# Patient Record
Sex: Male | Born: 1950 | Race: White | Hispanic: No | Marital: Married | Smoking: Former smoker
Health system: Southern US, Community
[De-identification: ages and names within clinical notes are randomized; demographics above are authoritative.]

## PROBLEM LIST (undated history)

## (undated) DIAGNOSIS — M1611 Unilateral primary osteoarthritis, right hip: Secondary | ICD-10-CM

## (undated) DIAGNOSIS — M1990 Unspecified osteoarthritis, unspecified site: Secondary | ICD-10-CM

## (undated) DIAGNOSIS — M1612 Unilateral primary osteoarthritis, left hip: Secondary | ICD-10-CM

## (undated) DIAGNOSIS — E119 Type 2 diabetes mellitus without complications: Secondary | ICD-10-CM

## (undated) DIAGNOSIS — I10 Essential (primary) hypertension: Secondary | ICD-10-CM

## (undated) HISTORY — PX: FOOT SURGERY: SHX648

## (undated) HISTORY — PX: CERVICAL FUSION: SHX112

---

## 1999-09-12 ENCOUNTER — Emergency Department (HOSPITAL_COMMUNITY): Admission: EM | Admit: 1999-09-12 | Discharge: 1999-09-12 | Payer: Self-pay | Admitting: Emergency Medicine

## 2010-06-24 ENCOUNTER — Ambulatory Visit (HOSPITAL_COMMUNITY): Admission: RE | Admit: 2010-06-24 | Discharge: 2010-06-24 | Payer: Self-pay | Admitting: Neurosurgery

## 2010-07-12 ENCOUNTER — Encounter: Admission: RE | Admit: 2010-07-12 | Discharge: 2010-07-12 | Payer: Self-pay | Admitting: Neurosurgery

## 2010-12-05 ENCOUNTER — Encounter: Payer: Self-pay | Admitting: Neurosurgery

## 2011-01-28 LAB — CBC
HCT: 45.9 % (ref 39.0–52.0)
Hemoglobin: 16.1 g/dL (ref 13.0–17.0)
MCV: 94.8 fL (ref 78.0–100.0)
Platelets: 236 10*3/uL (ref 150–400)
RBC: 4.84 MIL/uL (ref 4.22–5.81)

## 2011-01-28 LAB — BASIC METABOLIC PANEL
Calcium: 9.2 mg/dL (ref 8.4–10.5)
GFR calc Af Amer: >60 mL/min (ref >60)
Potassium: 4.4 mEq/L (ref 3.5–5.1)

## 2011-01-28 LAB — SURGICAL PCR SCREEN
MRSA, PCR: NEGATIVE
Staphylococcus aureus: NEGATIVE

## 2012-08-13 ENCOUNTER — Other Ambulatory Visit: Payer: Self-pay | Admitting: Sports Medicine

## 2012-08-13 DIAGNOSIS — M25562 Pain in left knee: Secondary | ICD-10-CM

## 2012-08-14 ENCOUNTER — Ambulatory Visit
Admission: RE | Admit: 2012-08-14 | Discharge: 2012-08-14 | Disposition: A | Discharge status: Home / Self Care | Payer: Managed Care, Other (non HMO) | Source: Ambulatory Visit | Attending: Sports Medicine | Admitting: Sports Medicine

## 2012-08-14 DIAGNOSIS — M25562 Pain in left knee: Secondary | ICD-10-CM

## 2014-06-26 ENCOUNTER — Encounter (HOSPITAL_COMMUNITY): Payer: Self-pay | Admitting: Pharmacist

## 2014-06-26 NOTE — Pre-Procedure Instructions (Signed)
James Duran  06/26/2014   Your procedure is scheduled on: Tuesday, July 08, 2014  Report to [facility] Admitting at 8:15 AM.  Call this number if you have problems the morning of surgery: [phone]   Remember:   Do not eat food or drink liquids after midnight Monday, July 07, 2014   Take these medicines the morning of surgery with A SIP OF WATER: varenicline (CHANTIX)  Stop taking Aspirin,  vitamins, and herbal medications. Do not take any NSAIDs ie: Ibuprofen, Advil, Naproxen or any medication containing Aspirin; stop 7 days prior to procedure ( Tuesday, 07/01/14).   Do not wear jewelry, make-up or nail polish.  Do not wear lotions, powders, or perfumes. You may not wear deodorant.  Do not shave 48 hours prior to surgery. Men may shave face and neck.  Do not bring valuables to the hospital.  [facility] is not responsible for any belongings or valuables.               Contacts, dentures or bridgework may not be worn into surgery.  Leave suitcase in the car. After surgery it may be brought to your room.  For patients admitted to the hospital, discharge time is determined by your treatment team.               Patients discharged the day of surgery will not be allowed to drive home.  Name and phone number of your driver:   Special Instructions:  Special Instructions:Special Instructions: [facility] - Preparing for Surgery  Before surgery, you can play an important role.  Because skin is not sterile, your skin needs to be as free of germs as possible.  You can reduce the number of germs on you skin by washing with CHG (chlorahexidine gluconate) soap before surgery.  CHG is an antiseptic cleaner which kills germs and bonds with the skin to continue killing germs even after washing.  Please DO NOT use if you have an allergy to CHG or antibacterial soaps.  If your skin becomes reddened/irritated stop using the CHG and inform your nurse when you arrive at Short  Stay.  Do not shave (including legs and underarms) for at least 48 hours prior to the first CHG shower.  You may shave your face.  Please follow these instructions carefully:   1.  Shower with CHG Soap the night before surgery and the morning of Surgery.  2.  If you choose to wash your hair, wash your hair first as usual with your normal shampoo.  3.  After you shampoo, rinse your hair and body thoroughly to remove the Shampoo.  4.  Use CHG as you would any other liquid soap.  You can apply chg directly  to the skin and wash gently with scrungie or a clean washcloth.  5.  Apply the CHG Soap to your body ONLY FROM THE NECK DOWN.  Do not use on open wounds or open sores.  Avoid contact with your eyes, ears, mouth and genitals (private parts).  Wash genitals (private parts) with your normal soap.  6.  Wash thoroughly, paying special attention to the area where your surgery will be performed.  7.  Thoroughly rinse your body with warm water from the neck down.  8.  DO NOT shower/wash with your normal soap after using and rinsing off the CHG Soap.  9.  Pat yourself dry with a clean towel.            10.  Wear clean pajamas.            11.  Place clean sheets on your bed the night of your first shower and do not sleep with pets.  Day of Surgery  Do not apply any lotions/deodorants the morning of surgery.  Please wear clean clothes to the hospital/surgery center.   Please read over the following fact sheets that you were given: Pain Booklet, Coughing and Deep Breathing, Blood Transfusion Information, Total Joint Packet, MRSA Information and Surgical Site Infection Prevention

## 2014-06-27 ENCOUNTER — Encounter (HOSPITAL_COMMUNITY): Payer: Self-pay

## 2014-06-27 ENCOUNTER — Encounter (HOSPITAL_COMMUNITY)
Admission: RE | Admit: 2014-06-27 | Discharge: 2014-06-27 | Disposition: A | Discharge status: Home / Self Care | Payer: BC Managed Care – PPO | Source: Ambulatory Visit | Attending: Orthopedic Surgery | Admitting: Orthopedic Surgery

## 2014-06-27 ENCOUNTER — Encounter (HOSPITAL_COMMUNITY)
Admission: RE | Admit: 2014-06-27 | Discharge: 2014-06-27 | Disposition: A | Discharge status: Home / Self Care | Payer: BC Managed Care – PPO | Source: Ambulatory Visit | Attending: Anesthesiology | Admitting: Anesthesiology

## 2014-06-27 DIAGNOSIS — I4510 Unspecified right bundle-branch block: Secondary | ICD-10-CM | POA: Diagnosis not present

## 2014-06-27 DIAGNOSIS — E785 Hyperlipidemia, unspecified: Secondary | ICD-10-CM | POA: Insufficient documentation

## 2014-06-27 DIAGNOSIS — Z01810 Encounter for preprocedural cardiovascular examination: Secondary | ICD-10-CM | POA: Insufficient documentation

## 2014-06-27 DIAGNOSIS — E119 Type 2 diabetes mellitus without complications: Secondary | ICD-10-CM | POA: Insufficient documentation

## 2014-06-27 DIAGNOSIS — Z01812 Encounter for preprocedural laboratory examination: Secondary | ICD-10-CM | POA: Insufficient documentation

## 2014-06-27 DIAGNOSIS — F17200 Nicotine dependence, unspecified, uncomplicated: Secondary | ICD-10-CM | POA: Insufficient documentation

## 2014-06-27 DIAGNOSIS — Z01818 Encounter for other preprocedural examination: Secondary | ICD-10-CM | POA: Diagnosis present

## 2014-06-27 DIAGNOSIS — I10 Essential (primary) hypertension: Secondary | ICD-10-CM | POA: Diagnosis not present

## 2014-06-27 HISTORY — DX: Essential (primary) hypertension: I10

## 2014-06-27 HISTORY — DX: Unspecified osteoarthritis, unspecified site: M19.90

## 2014-06-27 HISTORY — DX: Type 2 diabetes mellitus without complications: E11.9

## 2014-06-27 LAB — BASIC METABOLIC PANEL
Anion gap: 12 (ref 5–15)
BUN: 19 mg/dL (ref 6–23)
CALCIUM: 9.2 mg/dL (ref 8.4–10.5)
CO2: 24 meq/L (ref 19–32)
Chloride: 104 mEq/L (ref 96–112)
Creatinine, Ser: 0.93 mg/dL (ref 0.50–1.35)
GFR calc Af Amer: >90 mL/min (ref >90)
GFR, EST NON AFRICAN AMERICAN: 89 mL/min — AB (ref >90)
Glucose, Bld: 132 mg/dL — ABNORMAL HIGH (ref 70–99)
POTASSIUM: 4.4 meq/L (ref 3.7–5.3)
Sodium: 140 mEq/L (ref 137–147)

## 2014-06-27 LAB — CBC
HCT: 43 % (ref 39.0–52.0)
HEMOGLOBIN: 14.6 g/dL (ref 13.0–17.0)
MCH: 31.7 pg (ref 26.0–34.0)
MCHC: 34 g/dL (ref 30.0–36.0)
MCV: 93.3 fL (ref 78.0–100.0)
PLATELETS: 301 10*3/uL (ref 150–400)
RBC: 4.61 MIL/uL (ref 4.22–5.81)
RDW: 12.9 % (ref 11.5–15.5)
WBC: 8.6 10*3/uL (ref 4.0–10.5)

## 2014-06-27 LAB — SURGICAL PCR SCREEN
MRSA, PCR: NEGATIVE
STAPHYLOCOCCUS AUREUS: NEGATIVE

## 2014-06-27 LAB — PROTIME-INR
INR: 0.98 (ref 0.00–1.49)
Prothrombin Time: 13 seconds (ref 11.6–15.2)

## 2014-06-27 LAB — ABO/RH: ABO/RH(D): B POS

## 2014-06-27 LAB — APTT: aPTT: 28 seconds (ref 24–37)

## 2014-06-27 NOTE — Progress Notes (Signed)
06/27/14 0831  OBSTRUCTIVE SLEEP APNEA  Have you ever been diagnosed with sleep apnea through a sleep study? No  Do you snore loudly (loud enough to be heard through closed doors)?  1  Do you often feel tired, fatigued, or sleepy during the daytime? 0  Has anyone observed you stop breathing during your sleep? 0  Do you have, or are you being treated for high blood pressure? 0  BMI more than 35 kg/m2? 1  Age over 61 years old? 1  Neck circumference greater than 40 cm/16 inches? 0  Gender: 1  Obstructive Sleep Apnea Score 4  Score 4 or greater  Results sent to PCP

## 2014-06-27 NOTE — Progress Notes (Signed)
.   Called scheduler for Dr. Beverely PaceLandau,sherri, informed no orders in epic for preadmission visit.

## 2014-06-30 ENCOUNTER — Ambulatory Visit: Payer: Self-pay | Admitting: Orthopedic Surgery

## 2014-06-30 NOTE — Progress Notes (Signed)
Anesthesia Chart Review:  Pt is 63 year old male posted for total hip arthroplasty on 07/08/14 with Dr. Dion SaucierLandau.   PMH: diabetes, HTN, hyperlipidemia. Current smoker.   Preoperative labs reviewed.   Cxr reviewed. Per radiologist, opacity on x-ray could be nipple shadow; recommends repeat with nipple markers. [facility] and [facility]. Shelba FlakeLandau's office.   EKG shows sinus bradycardia (55bpm) and incomplete RBBB. Confirmed by Dr. Nicki Guadalajarahomas Kelly who notes no changes in EKG compared with previous in 2011.   If no changes, I anticipate pt can proceed with surgery as scheduled.   Rica Mastngela Hadasah Brugger, FNP-BC [facility]/Anesthesiology Phone: [phone] 06/30/2014 3:10 PM

## 2014-07-07 MED ORDER — CEFAZOLIN SODIUM-DEXTROSE 2-3 GM-% IV SOLR
2.0000 g | INTRAVENOUS | Status: AC
  Administered 2014-07-08: 2 g via INTRAVENOUS
  Filled 2014-07-07: qty 50

## 2014-07-08 ENCOUNTER — Encounter (HOSPITAL_COMMUNITY)
Admission: RE | Disposition: A | Discharge status: Home Health | Payer: Self-pay | Source: Ambulatory Visit | Attending: Orthopedic Surgery

## 2014-07-08 ENCOUNTER — Encounter (HOSPITAL_COMMUNITY): Payer: Self-pay | Admitting: *Deleted

## 2014-07-08 ENCOUNTER — Inpatient Hospital Stay (HOSPITAL_COMMUNITY)
Admission: RE | Admit: 2014-07-08 | Discharge: 2014-07-09 | DRG: 470 | Disposition: A | Discharge status: Home Health | Payer: BC Managed Care – PPO | Source: Ambulatory Visit | Attending: Orthopedic Surgery | Admitting: Orthopedic Surgery

## 2014-07-08 ENCOUNTER — Inpatient Hospital Stay (HOSPITAL_COMMUNITY): Payer: BC Managed Care – PPO | Admitting: Anesthesiology

## 2014-07-08 ENCOUNTER — Encounter (HOSPITAL_COMMUNITY): Payer: BC Managed Care – PPO | Admitting: Emergency Medicine

## 2014-07-08 ENCOUNTER — Inpatient Hospital Stay (HOSPITAL_COMMUNITY): Payer: BC Managed Care – PPO

## 2014-07-08 DIAGNOSIS — Z981 Arthrodesis status: Secondary | ICD-10-CM

## 2014-07-08 DIAGNOSIS — M169 Osteoarthritis of hip, unspecified: Principal | ICD-10-CM | POA: Diagnosis present

## 2014-07-08 DIAGNOSIS — M1610 Unilateral primary osteoarthritis, unspecified hip: Secondary | ICD-10-CM | POA: Diagnosis present

## 2014-07-08 DIAGNOSIS — M1611 Unilateral primary osteoarthritis, right hip: Secondary | ICD-10-CM | POA: Diagnosis present

## 2014-07-08 DIAGNOSIS — I10 Essential (primary) hypertension: Secondary | ICD-10-CM | POA: Diagnosis present

## 2014-07-08 DIAGNOSIS — Z7901 Long term (current) use of anticoagulants: Secondary | ICD-10-CM

## 2014-07-08 DIAGNOSIS — Z79899 Other long term (current) drug therapy: Secondary | ICD-10-CM

## 2014-07-08 DIAGNOSIS — E119 Type 2 diabetes mellitus without complications: Secondary | ICD-10-CM | POA: Diagnosis present

## 2014-07-08 DIAGNOSIS — F17200 Nicotine dependence, unspecified, uncomplicated: Secondary | ICD-10-CM | POA: Diagnosis present

## 2014-07-08 DIAGNOSIS — G8918 Other acute postprocedural pain: Secondary | ICD-10-CM | POA: Diagnosis not present

## 2014-07-08 HISTORY — DX: Unilateral primary osteoarthritis, right hip: M16.11

## 2014-07-08 HISTORY — PX: TOTAL HIP ARTHROPLASTY: SHX124

## 2014-07-08 LAB — GLUCOSE, CAPILLARY
GLUCOSE-CAPILLARY: 131 mg/dL — AB (ref 70–99)
GLUCOSE-CAPILLARY: 115 mg/dL — AB (ref 70–99)
Glucose-Capillary: 125 mg/dL — ABNORMAL HIGH (ref 70–99)

## 2014-07-08 LAB — TYPE AND SCREEN
ABO/RH(D): B POS
ABO/RH(D): B POS
ANTIBODY SCREEN: NEGATIVE
Antibody Screen: NEGATIVE

## 2014-07-08 SURGERY — ARTHROPLASTY, HIP, TOTAL,POSTERIOR APPROACH
Anesthesia: General | Laterality: Right

## 2014-07-08 MED ORDER — RIVAROXABAN 10 MG PO TABS: 10.0000 mg | ORAL_TABLET | Freq: Every day | ORAL | Status: DC

## 2014-07-08 MED ORDER — CEFAZOLIN SODIUM-DEXTROSE 2-3 GM-% IV SOLR
2.0000 g | Freq: Four times a day (QID) | INTRAVENOUS | Status: AC
  Administered 2014-07-08 – 2014-07-09 (×2): 2 g via INTRAVENOUS
  Filled 2014-07-08 (×2): qty 50

## 2014-07-08 MED ORDER — PROMETHAZINE HCL 25 MG/ML IJ SOLN: 6.2500 mg | INTRAMUSCULAR | Status: DC | PRN

## 2014-07-08 MED ORDER — FENTANYL CITRATE 0.05 MG/ML IJ SOLN
INTRAMUSCULAR | Status: AC
  Filled 2014-07-08: qty 5

## 2014-07-08 MED ORDER — POTASSIUM CHLORIDE IN NACL 20-0.45 MEQ/L-% IV SOLN
INTRAVENOUS | Status: DC
  Administered 2014-07-08: 22:00:00 via INTRAVENOUS
  Filled 2014-07-08 (×3): qty 1000

## 2014-07-08 MED ORDER — METHOCARBAMOL 500 MG PO TABS
500.0000 mg | ORAL_TABLET | Freq: Four times a day (QID) | ORAL | Status: DC | PRN
  Administered 2014-07-08: 500 mg via ORAL
  Filled 2014-07-08: qty 1

## 2014-07-08 MED ORDER — FENTANYL CITRATE 0.05 MG/ML IJ SOLN
INTRAMUSCULAR | Status: AC
  Filled 2014-07-08: qty 2

## 2014-07-08 MED ORDER — ROCURONIUM BROMIDE 50 MG/5ML IV SOLN
INTRAVENOUS | Status: AC
  Filled 2014-07-08: qty 1

## 2014-07-08 MED ORDER — MIDAZOLAM HCL 2 MG/2ML IJ SOLN
INTRAMUSCULAR | Status: AC
  Filled 2014-07-08: qty 2

## 2014-07-08 MED ORDER — DOCUSATE SODIUM 100 MG PO CAPS
100.0000 mg | ORAL_CAPSULE | Freq: Two times a day (BID) | ORAL | Status: DC
  Administered 2014-07-08 – 2014-07-09 (×2): 100 mg via ORAL
  Filled 2014-07-08 (×2): qty 1

## 2014-07-08 MED ORDER — STERILE WATER FOR INJECTION IJ SOLN
INTRAMUSCULAR | Status: AC
  Filled 2014-07-08: qty 10

## 2014-07-08 MED ORDER — NEOSTIGMINE METHYLSULFATE 10 MG/10ML IV SOLN
INTRAVENOUS | Status: DC | PRN
  Administered 2014-07-08: 4 mg via INTRAVENOUS

## 2014-07-08 MED ORDER — LIDOCAINE HCL (CARDIAC) 20 MG/ML IV SOLN
INTRAVENOUS | Status: AC
  Filled 2014-07-08: qty 20

## 2014-07-08 MED ORDER — KETOROLAC TROMETHAMINE 15 MG/ML IJ SOLN
INTRAMUSCULAR | Status: AC
  Filled 2014-07-08: qty 1

## 2014-07-08 MED ORDER — LOSARTAN POTASSIUM 50 MG PO TABS
50.0000 mg | ORAL_TABLET | Freq: Every day | ORAL | Status: DC
  Administered 2014-07-08 – 2014-07-09 (×2): 50 mg via ORAL
  Filled 2014-07-08 (×2): qty 1

## 2014-07-08 MED ORDER — ONDANSETRON HCL 4 MG/2ML IJ SOLN: 4.0000 mg | Freq: Four times a day (QID) | INTRAMUSCULAR | Status: DC | PRN

## 2014-07-08 MED ORDER — HYDROMORPHONE HCL PF 1 MG/ML IJ SOLN: 1.0000 mg | INTRAMUSCULAR | Status: DC | PRN

## 2014-07-08 MED ORDER — HYDRALAZINE HCL 20 MG/ML IJ SOLN
INTRAMUSCULAR | Status: DC | PRN
  Administered 2014-07-08: 5 mg via INTRAVENOUS

## 2014-07-08 MED ORDER — NEOSTIGMINE METHYLSULFATE 10 MG/10ML IV SOLN
INTRAVENOUS | Status: AC
  Filled 2014-07-08: qty 1

## 2014-07-08 MED ORDER — ACETAMINOPHEN 650 MG RE SUPP: 650.0000 mg | Freq: Four times a day (QID) | RECTAL | Status: DC | PRN

## 2014-07-08 MED ORDER — OXYCODONE-ACETAMINOPHEN 10-325 MG PO TABS: 1.0000 | ORAL_TABLET | Freq: Four times a day (QID) | ORAL | Status: DC | PRN

## 2014-07-08 MED ORDER — PROPOFOL 10 MG/ML IV BOLUS
INTRAVENOUS | Status: AC
  Filled 2014-07-08: qty 20

## 2014-07-08 MED ORDER — LACTATED RINGERS IV SOLN
INTRAVENOUS | Status: DC
  Administered 2014-07-08 (×2): via INTRAVENOUS

## 2014-07-08 MED ORDER — MAGNESIUM CITRATE PO SOLN: 1.0000 | Freq: Once | ORAL | Status: AC | PRN

## 2014-07-08 MED ORDER — RIVAROXABAN 10 MG PO TABS
10.0000 mg | ORAL_TABLET | Freq: Every day | ORAL | Status: DC
  Administered 2014-07-09: 10 mg via ORAL
  Filled 2014-07-08 (×2): qty 1

## 2014-07-08 MED ORDER — SENNA-DOCUSATE SODIUM 8.6-50 MG PO TABS: 2.0000 | ORAL_TABLET | Freq: Every day | ORAL | Status: DC

## 2014-07-08 MED ORDER — EPHEDRINE SULFATE 50 MG/ML IJ SOLN
INTRAMUSCULAR | Status: AC
  Filled 2014-07-08: qty 1

## 2014-07-08 MED ORDER — PROPOFOL 10 MG/ML IV BOLUS
INTRAVENOUS | Status: DC | PRN
  Administered 2014-07-08: 160 ug via INTRAVENOUS

## 2014-07-08 MED ORDER — DEXAMETHASONE SODIUM PHOSPHATE 4 MG/ML IJ SOLN
INTRAMUSCULAR | Status: AC
  Filled 2014-07-08: qty 1

## 2014-07-08 MED ORDER — FENTANYL CITRATE 0.05 MG/ML IJ SOLN
INTRAMUSCULAR | Status: DC | PRN
  Administered 2014-07-08 (×2): 100 ug via INTRAVENOUS
  Administered 2014-07-08 (×4): 50 ug via INTRAVENOUS
  Administered 2014-07-08: 100 ug via INTRAVENOUS

## 2014-07-08 MED ORDER — ONDANSETRON HCL 4 MG/2ML IJ SOLN
INTRAMUSCULAR | Status: AC
  Filled 2014-07-08: qty 4

## 2014-07-08 MED ORDER — METOCLOPRAMIDE HCL 5 MG/ML IJ SOLN: 5.0000 mg | Freq: Three times a day (TID) | INTRAMUSCULAR | Status: DC | PRN

## 2014-07-08 MED ORDER — ONDANSETRON HCL 4 MG/2ML IJ SOLN
INTRAMUSCULAR | Status: DC | PRN
  Administered 2014-07-08: 4 mg via INTRAVENOUS

## 2014-07-08 MED ORDER — BISACODYL 10 MG RE SUPP: 10.0000 mg | Freq: Every day | RECTAL | Status: DC | PRN

## 2014-07-08 MED ORDER — ALUM & MAG HYDROXIDE-SIMETH 200-200-20 MG/5ML PO SUSP: 30.0000 mL | ORAL | Status: DC | PRN

## 2014-07-08 MED ORDER — FENTANYL CITRATE 0.05 MG/ML IJ SOLN
INTRAMUSCULAR | Status: AC
Start: 2014-07-08 — End: 2014-07-09
  Filled 2014-07-08: qty 2

## 2014-07-08 MED ORDER — VARENICLINE TARTRATE 1 MG PO TABS
1.0000 mg | ORAL_TABLET | Freq: Every day | ORAL | Status: DC
  Administered 2014-07-09: 1 mg via ORAL
  Filled 2014-07-08: qty 1

## 2014-07-08 MED ORDER — ONDANSETRON HCL 4 MG PO TABS: 4.0000 mg | ORAL_TABLET | Freq: Four times a day (QID) | ORAL | Status: DC | PRN

## 2014-07-08 MED ORDER — ROCURONIUM BROMIDE 50 MG/5ML IV SOLN
INTRAVENOUS | Status: AC
  Filled 2014-07-08: qty 4

## 2014-07-08 MED ORDER — METHOCARBAMOL 1000 MG/10ML IJ SOLN
500.0000 mg | Freq: Four times a day (QID) | INTRAMUSCULAR | Status: DC | PRN
  Filled 2014-07-08: qty 5

## 2014-07-08 MED ORDER — METOCLOPRAMIDE HCL 10 MG PO TABS: 5.0000 mg | ORAL_TABLET | Freq: Three times a day (TID) | ORAL | Status: DC | PRN

## 2014-07-08 MED ORDER — MEPERIDINE HCL 25 MG/ML IJ SOLN
6.2500 mg | INTRAMUSCULAR | Status: DC | PRN
  Administered 2014-07-08: 12.5 mg via INTRAVENOUS

## 2014-07-08 MED ORDER — BACLOFEN 10 MG PO TABS: 10.0000 mg | ORAL_TABLET | Freq: Three times a day (TID) | ORAL | Status: DC

## 2014-07-08 MED ORDER — LIDOCAINE HCL (CARDIAC) 20 MG/ML IV SOLN
INTRAVENOUS | Status: DC | PRN
  Administered 2014-07-08: 8 mg via INTRAVENOUS

## 2014-07-08 MED ORDER — GLYCOPYRROLATE 0.2 MG/ML IJ SOLN
INTRAMUSCULAR | Status: DC | PRN
  Administered 2014-07-08: 0.6 mg via INTRAVENOUS

## 2014-07-08 MED ORDER — POLYETHYLENE GLYCOL 3350 17 G PO PACK
17.0000 g | PACK | Freq: Every day | ORAL | Status: DC | PRN
Start: 2014-07-08 — End: 2014-07-09

## 2014-07-08 MED ORDER — ACETAMINOPHEN 325 MG PO TABS: 650.0000 mg | ORAL_TABLET | Freq: Four times a day (QID) | ORAL | Status: DC | PRN

## 2014-07-08 MED ORDER — KETOROLAC TROMETHAMINE 15 MG/ML IJ SOLN
15.0000 mg | Freq: Four times a day (QID) | INTRAMUSCULAR | Status: AC
  Administered 2014-07-08 – 2014-07-09 (×4): 15 mg via INTRAVENOUS
  Filled 2014-07-08 (×3): qty 1

## 2014-07-08 MED ORDER — GLYCOPYRROLATE 0.2 MG/ML IJ SOLN
INTRAMUSCULAR | Status: AC
  Filled 2014-07-08: qty 3

## 2014-07-08 MED ORDER — METHOCARBAMOL 500 MG PO TABS
ORAL_TABLET | ORAL | Status: AC
Start: 2014-07-08 — End: 2014-07-09
  Filled 2014-07-08: qty 1

## 2014-07-08 MED ORDER — ONDANSETRON HCL 4 MG/2ML IJ SOLN
INTRAMUSCULAR | Status: AC
  Filled 2014-07-08: qty 2

## 2014-07-08 MED ORDER — CITRIC ACID-SODIUM CITRATE 334-500 MG/5ML PO SOLN
30.0000 mL | Freq: Once | ORAL | Status: AC
  Administered 2014-07-08: 30 mL via ORAL
  Filled 2014-07-08: qty 15

## 2014-07-08 MED ORDER — MEPERIDINE HCL 25 MG/ML IJ SOLN
INTRAMUSCULAR | Status: AC
  Filled 2014-07-08: qty 1

## 2014-07-08 MED ORDER — SENNA 8.6 MG PO TABS
1.0000 | ORAL_TABLET | Freq: Two times a day (BID) | ORAL | Status: DC
  Administered 2014-07-08 – 2014-07-09 (×2): 8.6 mg via ORAL
  Filled 2014-07-08 (×3): qty 1

## 2014-07-08 MED ORDER — ONDANSETRON HCL 4 MG PO TABS: 4.0000 mg | ORAL_TABLET | Freq: Three times a day (TID) | ORAL | Status: DC | PRN

## 2014-07-08 MED ORDER — OXYCODONE HCL 5 MG PO TABS: 5.0000 mg | ORAL_TABLET | ORAL | Status: DC | PRN

## 2014-07-08 MED ORDER — MENTHOL 3 MG MT LOZG: 1.0000 | LOZENGE | OROMUCOSAL | Status: DC | PRN

## 2014-07-08 MED ORDER — SIMVASTATIN 5 MG PO TABS
5.0000 mg | ORAL_TABLET | Freq: Every day | ORAL | Status: DC
Start: 2014-07-09 — End: 2014-07-09
  Filled 2014-07-08: qty 1

## 2014-07-08 MED ORDER — SUCCINYLCHOLINE CHLORIDE 20 MG/ML IJ SOLN
INTRAMUSCULAR | Status: AC
  Filled 2014-07-08: qty 1

## 2014-07-08 MED ORDER — FENTANYL CITRATE 0.05 MG/ML IJ SOLN
INTRAMUSCULAR | Status: AC
Start: 2014-07-08 — End: 2014-07-08
  Filled 2014-07-08: qty 5

## 2014-07-08 MED ORDER — 0.9 % SODIUM CHLORIDE (POUR BTL) OPTIME
TOPICAL | Status: DC | PRN
  Administered 2014-07-08: 1000 mL

## 2014-07-08 MED ORDER — SUCCINYLCHOLINE CHLORIDE 20 MG/ML IJ SOLN
INTRAMUSCULAR | Status: DC | PRN
  Administered 2014-07-08: 100 mg via INTRAVENOUS

## 2014-07-08 MED ORDER — FENTANYL CITRATE 0.05 MG/ML IJ SOLN
25.0000 ug | INTRAMUSCULAR | Status: DC | PRN
  Administered 2014-07-08 (×3): 50 ug via INTRAVENOUS

## 2014-07-08 MED ORDER — PHENOL 1.4 % MT LIQD: 1.0000 | OROMUCOSAL | Status: DC | PRN

## 2014-07-08 MED ORDER — DEXAMETHASONE SODIUM PHOSPHATE 4 MG/ML IJ SOLN
INTRAMUSCULAR | Status: DC | PRN
  Administered 2014-07-08: 4 mg via INTRAVENOUS

## 2014-07-08 MED ORDER — ROCURONIUM BROMIDE 100 MG/10ML IV SOLN
INTRAVENOUS | Status: DC | PRN
  Administered 2014-07-08: 25 mg via INTRAVENOUS
  Administered 2014-07-08: 10 mg via INTRAVENOUS

## 2014-07-08 MED ORDER — INSULIN ASPART 100 UNIT/ML ~~LOC~~ SOLN
0.0000 [IU] | Freq: Three times a day (TID) | SUBCUTANEOUS | Status: DC
  Administered 2014-07-09: 3 [IU] via SUBCUTANEOUS
  Administered 2014-07-09: 2 [IU] via SUBCUTANEOUS

## 2014-07-08 MED ORDER — DIPHENHYDRAMINE HCL 12.5 MG/5ML PO ELIX: 12.5000 mg | ORAL_SOLUTION | ORAL | Status: DC | PRN

## 2014-07-08 SURGICAL SUPPLY — 61 items
BENZOIN TINCTURE PRP APPL 2/3 (GAUZE/BANDAGES/DRESSINGS) ×3 IMPLANT
BIT DRILL 5/64X5 DISP (BIT) ×3 IMPLANT
BLADE SAW SAG 73X25 THK (BLADE) ×2
BLADE SAW SGTL 73X25 THK (BLADE) ×1 IMPLANT
BRUSH FEMORAL CANAL (MISCELLANEOUS) IMPLANT
CAPT HIP PF MOP ×3 IMPLANT
CLOSURE STERI-STRIP 1/2X4 (GAUZE/BANDAGES/DRESSINGS) ×1
CLSR STERI-STRIP ANTIMIC 1/2X4 (GAUZE/BANDAGES/DRESSINGS) ×2 IMPLANT
COVER SURGICAL LIGHT HANDLE (MISCELLANEOUS) ×3 IMPLANT
DRAPE INCISE IOBAN 66X45 STRL (DRAPES) ×3 IMPLANT
DRAPE ORTHO SPLIT 77X108 STRL (DRAPES) ×4
DRAPE PROXIMA HALF (DRAPES) ×3 IMPLANT
DRAPE SURG ORHT 6 SPLT 77X108 (DRAPES) ×2 IMPLANT
DRAPE U-SHAPE 47X51 STRL (DRAPES) ×3 IMPLANT
DRILL BIT 5/64 (BIT) ×3 IMPLANT
DRSG MEPILEX BORDER 4X12 (GAUZE/BANDAGES/DRESSINGS) IMPLANT
DRSG MEPILEX BORDER 4X8 (GAUZE/BANDAGES/DRESSINGS) ×3 IMPLANT
DRSG PAD ABDOMINAL 8X10 ST (GAUZE/BANDAGES/DRESSINGS) IMPLANT
DURAPREP 26ML APPLICATOR (WOUND CARE) ×3 IMPLANT
ELECT CAUTERY BLADE 6.4 (BLADE) ×3 IMPLANT
ELECT REM PT RETURN 9FT ADLT (ELECTROSURGICAL) ×3
ELECTRODE REM PT RTRN 9FT ADLT (ELECTROSURGICAL) ×1 IMPLANT
GAUZE SPONGE 4X4 12PLY STRL (GAUZE/BANDAGES/DRESSINGS) IMPLANT
GLOVE BIOGEL PI IND STRL 6.5 (GLOVE) ×1 IMPLANT
GLOVE BIOGEL PI INDICATOR 6.5 (GLOVE) ×2
GLOVE BIOGEL PI ORTHO PRO SZ8 (GLOVE) ×2
GLOVE ECLIPSE 6.5 STRL STRAW (GLOVE) ×3 IMPLANT
GLOVE ORTHO TXT STRL SZ7.5 (GLOVE) ×3 IMPLANT
GLOVE PI ORTHO PRO STRL SZ8 (GLOVE) ×1 IMPLANT
GLOVE SURG ORTHO 8.0 STRL STRW (GLOVE) ×3 IMPLANT
GOWN STRL REUS W/ TWL LRG LVL3 (GOWN DISPOSABLE) ×2 IMPLANT
GOWN STRL REUS W/TWL LRG LVL3 (GOWN DISPOSABLE) ×4
HANDPIECE INTERPULSE COAX TIP (DISPOSABLE)
HOOD PEEL AWAY FACE SHEILD DIS (HOOD) ×9 IMPLANT
KIT BASIN OR (CUSTOM PROCEDURE TRAY) ×3 IMPLANT
KIT ROOM TURNOVER OR (KITS) ×3 IMPLANT
MANIFOLD NEPTUNE II (INSTRUMENTS) ×3 IMPLANT
NEEDLE HYPO 25GX1X1/2 BEV (NEEDLE) ×3 IMPLANT
NS IRRIG 1000ML POUR BTL (IV SOLUTION) ×3 IMPLANT
PACK TOTAL JOINT (CUSTOM PROCEDURE TRAY) ×3 IMPLANT
PAD ARMBOARD 7.5X6 YLW CONV (MISCELLANEOUS) ×6 IMPLANT
PILLOW ABDUCTION HIP (SOFTGOODS) ×3 IMPLANT
PRESSURIZER FEMORAL UNIV (MISCELLANEOUS) IMPLANT
RETRIEVER SUT HEWSON (MISCELLANEOUS) ×3 IMPLANT
SET HNDPC FAN SPRY TIP SCT (DISPOSABLE) IMPLANT
SPONGE LAP 4X18 X RAY DECT (DISPOSABLE) IMPLANT
SUCTION FRAZIER TIP 10 FR DISP (SUCTIONS) ×3 IMPLANT
SUT FIBERWIRE #2 38 REV NDL BL (SUTURE) ×9
SUT MNCRL AB 4-0 PS2 18 (SUTURE) ×3 IMPLANT
SUT VIC AB 0 CT1 27 (SUTURE) ×4
SUT VIC AB 0 CT1 27XBRD ANBCTR (SUTURE) ×2 IMPLANT
SUT VIC AB 2-0 CT1 27 (SUTURE) ×4
SUT VIC AB 2-0 CT1 TAPERPNT 27 (SUTURE) ×2 IMPLANT
SUT VIC AB 3-0 SH 8-18 (SUTURE) ×3 IMPLANT
SUTURE FIBERWR#2 38 REV NDL BL (SUTURE) ×3 IMPLANT
SYR CONTROL 10ML LL (SYRINGE) ×3 IMPLANT
TOWEL OR 17X24 6PK STRL BLUE (TOWEL DISPOSABLE) ×3 IMPLANT
TOWEL OR 17X26 10 PK STRL BLUE (TOWEL DISPOSABLE) ×3 IMPLANT
TOWER CARTRIDGE SMART MIX (DISPOSABLE) IMPLANT
TRAY FOLEY CATH 14FR (SET/KITS/TRAYS/PACK) IMPLANT
WATER STERILE IRR 1000ML POUR (IV SOLUTION) IMPLANT

## 2014-07-08 NOTE — Discharge Instructions (Signed)
Diet: As you were doing prior to hospitalization  ° °Shower:  May shower but keep the wounds dry, use an occlusive plastic wrap, NO SOAKING IN TUB.  If the bandage gets wet, change with a clean dry gauze. ° °Dressing:  You may change your dressing 3-5 days after surgery.  Then change the dressing daily with sterile gauze dressing.   ° °There are sticky tapes (steri-strips) on your wounds and all the stitches are absorbable.  Leave the steri-strips in place when changing your dressings, they will peel off with time, usually 2-3 weeks. ° °Activity:  Increase activity slowly as tolerated, but follow the weight bearing instructions below.  No lifting or driving for 6 weeks. ° °Weight Bearing:   As tolerated.   ° °To prevent constipation: you may use a stool softener such as - ° °Colace (over the counter) 100 mg by mouth twice a day  °Drink plenty of fluids (prune juice may be helpful) and high fiber foods °Miralax (over the counter) for constipation as needed.   ° °Itching:  If you experience itching with your medications, try taking only a single pain pill, or even half a pain pill at a time.  You may take up to 10 pain pills per day, and you can also use benadryl over the counter for itching or also to help with sleep.  ° °Precautions:  If you experience chest pain or shortness of breath - call 911 immediately for transfer to the hospital emergency department!! ° °If you develop a fever greater that 101 F, purulent drainage from wound, increased redness or drainage from wound, or calf pain -- Call the office at [phone]                                                °Follow- Up Appointment:  Please call for an appointment to be seen in 2 weeks [place] - [phone] ° ° ° ° ° °

## 2014-07-08 NOTE — Transfer of Care (Signed)
Immediate Anesthesia Transfer of Care Note  Patient: James Duran  Procedure(s) Performed: Procedure(s): TOTAL HIP ARTHROPLASTY (Right)  Patient Location: PACU  Anesthesia Type:General  Level of Consciousness: awake, alert  and oriented  Airway & Oxygen Therapy: Patient Spontanous Breathing and Patient connected to nasal cannula oxygen  Post-op Assessment: Report given to PACU RN and Post -op Vital signs reviewed and stable  Post vital signs: Reviewed and stable  Complications: No apparent anesthesia complications

## 2014-07-08 NOTE — Progress Notes (Signed)
Dr. Berneice Heinrich was notified of patient drinking 8oz of coffee with milk in it

## 2014-07-08 NOTE — H&P (Signed)
PREOPERATIVE H&P  Chief Complaint: djd right hip  HPI: James Duran is a 63 y.o. male who presents for preoperative history and physical with a diagnosis of djd right hip. Symptoms are rated as moderate to severe, and have been worsening.  This is significantly impairing activities of daily living.  He has elected for surgical management. He has failed injections, activity modification, anti-inflammatories, and assistive devices.   Past Medical History  Diagnosis Date  . Diabetes mellitus without complication   . Arthritis   . Hypertension    Past Surgical History  Procedure Laterality Date  . Cervical fusion    . Foot surgery Right    History   Social History  . Marital Status: Married    Spouse Name: N/A    Number of Children: N/A  . Years of Education: N/A   Social History Main Topics  . Smoking status: Current Some Day Smoker -- 0.25 packs/day for 40 years    Types: Cigarettes  . Smokeless tobacco: None  . Alcohol Use: Yes     Comment: 2 glasses daily  . Drug Use: No  . Sexual Activity: None   Other Topics Concern  . None   Social History Narrative  . None   History reviewed. No pertinent family history. No Known Allergies Prior to Admission medications   Medication Sig Start Date End Date Taking? Authorizing Provider  aspirin (ASPIRIN EC) 81 MG EC tablet Take 81 mg by mouth daily. Swallow whole.   Yes Historical Provider, MD  losartan (COZAAR) 50 MG tablet Take 50 mg by mouth daily.   Yes Historical Provider, MD  metFORMIN (GLUMETZA) 500 MG (MOD) 24 hr tablet Take 500 mg by mouth daily with breakfast.   Yes Historical Provider, MD  pravastatin (PRAVACHOL) 10 MG tablet Take 10 mg by mouth daily.   Yes Historical Provider, MD  varenicline (CHANTIX) 1 MG tablet Take 1 mg by mouth daily.   Yes Historical Provider, MD     Positive ROS: All other systems have been reviewed and were otherwise negative with the exception of those mentioned in the HPI and as  above.  Physical Exam: General: Alert, no acute distress Cardiovascular: No pedal edema Respiratory: No cyanosis, no use of accessory musculature GI: No organomegaly, abdomen is soft and non-tender Skin: No lesions in the area of chief complaint Neurologic: Sensation intact distally Psychiatric: Patient is competent for consent with normal mood and affect Lymphatic: No axillary or cervical lymphadenopathy  MUSCULOSKELETAL: right hip rom 0-95 degrees, minimal IR, ER 10 deg.  EHL FHL intact.    Assessment: djd right hip  Plan: Plan for Procedure(s): TOTAL HIP ARTHROPLASTY  The risks benefits and alternatives were discussed with the patient including but not limited to the risks of nonoperative treatment, versus surgical intervention including infection, bleeding, nerve injury,  blood clots, cardiopulmonary complications, morbidity, mortality, among others, and they were willing to proceed.   He apparently had coffee with some milk this morning, and so we will plan to make him last case of the day if okay with anesthesia.   Eulas Post, MD Cell [phone]   07/08/2014 9:20 AM

## 2014-07-08 NOTE — Anesthesia Preprocedure Evaluation (Addendum)
Anesthesia Evaluation   Patient awake    Reviewed: Allergy & Precautions, H&P , NPO status , Patient's Chart, lab work & pertinent test results  Airway Mallampati: II TM Distance: >3 FB Neck ROM: Full    Dental  (+) Teeth Intact   Pulmonary Current Smoker (10 pack years),  breath sounds clear to auscultation        Cardiovascular hypertension, Rhythm:Regular Rate:Normal     Neuro/Psych    GI/Hepatic   Endo/Other  diabetes, Well Controlled, Type 2  Renal/GU      Musculoskeletal   Abdominal (+)  Abdomen: soft.    Peds  Hematology   Anesthesia Other Findings Had black coffee with one teaspoon milk at 7:00am.  Will give Bicitra PO pre-op  Reproductive/Obstetrics                          Anesthesia Physical Anesthesia Plan  ASA: III  Anesthesia Plan: General   Post-op Pain Management:    Induction: Intravenous and Rapid sequence  Airway Management Planned: Oral ETT  Additional Equipment:   Intra-op Plan:   Post-operative Plan: Extubation in OR  Informed Consent: I have reviewed the patients History and Physical, chart, labs and discussed the procedure including the risks, benefits and alternatives for the proposed anesthesia with the patient or authorized representative who has indicated his/her understanding and acceptance.     Plan Discussed with:   Anesthesia Plan Comments:        Anesthesia Quick Evaluation

## 2014-07-08 NOTE — Op Note (Signed)
07/08/2014  3:28 PM  PATIENT:  James Duran   MRN: [id number]  PRE-OPERATIVE DIAGNOSIS:  djd right hip  POST-OPERATIVE DIAGNOSIS:  djd right hip  PROCEDURE:  Procedure(s): TOTAL HIP ARTHROPLASTY  PREOPERATIVE INDICATIONS:    James Duran is an 63 y.o. male who has a diagnosis of Osteoarthritis of right hip and elected for surgical management after failing conservative treatment.  The risks benefits and alternatives were discussed with the patient including but not limited to the risks of nonoperative treatment, versus surgical intervention including infection, bleeding, nerve injury, periprosthetic fracture, the need for revision surgery, dislocation, leg length discrepancy, blood clots, cardiopulmonary complications, morbidity, mortality, among others, and they were willing to proceed.     OPERATIVE REPORT     SURGEON:  Marchia Bond, MD    ASSISTANT:  Joya Gaskins, OPA-C  (Present throughout the entire procedure,  necessary for completion of procedure in a timely manner, assisting with retraction, instrumentation, and closure)     ANESTHESIA:  General    COMPLICATIONS:  None.     COMPONENTS:  [employer] fit femur size 6 with a 36 mm +5 head ball and a gription acetabular shell size 54 with a 10 degree lipped polyethylene liner    PROCEDURE IN DETAIL:   The patient was met in the holding area and  identified.  The appropriate hip was identified and marked at the operative site.  The patient was then transported to the OR  and  placed under general anesthesia.  At that point, the patient was  placed in the lateral decubitus position with the operative side up and  secured to the operating room table and all bony prominences padded.     The operative lower extremity was prepped from the iliac crest to the distal leg.  Sterile draping was performed.  Time out was performed prior to incision.      A routine posterolateral approach was utilized via sharp dissection   carried down to the subcutaneous tissue.  Gross bleeders were Bovie coagulated.  The iliotibial band was identified and incised along the length of the skin incision.  Self-retaining retractors were  inserted.  With the hip internally rotated, the short external rotators  were identified. The piriformis and capsule was tagged with FiberWire, and the hip capsule released in a T-type fashion.  The femoral neck was exposed, and I resected the femoral neck using the appropriate jig. This was performed at approximately a thumb's breadth above the lesser trochanter.  I did end up cutting the neck twice to get adequate acetabular exposure.    I then exposed the deep acetabulum, cleared out any tissue including the ligamentum teres.  A wing retractor was placed.  After adequate visualization, I excised the labrum, and then sequentially reamed.  I placed the trial acetabulum, which seated nicely, and then impacted the real cup into place.  Appropriate version and inclination was confirmed clinically matching their bony anatomy, and also with the use of the jig.  A trial polyethylene liner was placed and the wing retractor removed.    I then prepared the proximal femur using the cookie-cutter, the lateralizing reamer, and then sequentially reamed and broached.  A trial broach, neck, and head was utilized, and I reduced the hip and it was found to have excellent stability with functional range of motion. The trial components were then removed, and the real polyethylene liner was placed with the lip directed posteriorly.  I then impacted the real femoral prosthesis  into place into the appropriate version, slightly anteverted to the normal anatomy, and I impacted the real head ball into place. The hip was then reduced and taken through functional range of motion and found to have excellent stability. Leg lengths were restored.  I trialed with the high offset, but it did not have a natural feel and soft tissue did not  reach the trochanter, so the standard was selected.  I then used a 2 mm drill bits to pass the FiberWire suture from the capsule and piriformis through the greater trochanter, and secured this. Excellent posterior capsular repair was achieved. I also closed the T in the capsule.  I then irrigated the hip copiously again with pulse lavage, and repaired the fascia with Vicryl, followed by Vicryl for the subcutaneous tissue, Monocryl for the skin, Steri-Strips and sterile gauze. The wounds were injected. The patient was then awakened and returned to PACU in stable and satisfactory condition. There were no complications.  Marchia Bond, MD Orthopedic Surgeon [phone]   07/08/2014 3:28 PM

## 2014-07-08 NOTE — Anesthesia Postprocedure Evaluation (Signed)
  Anesthesia Post-op Note  Patient: James Duran  Procedure(s) Performed: Procedure(s): TOTAL HIP ARTHROPLASTY (Right)  Patient Location: PACU  Anesthesia Type:General  Level of Consciousness: awake  Airway and Oxygen Therapy: Patient Spontanous Breathing  Post-op Pain: mild  Post-op Assessment: Post-op Vital signs reviewed  Post-op Vital Signs: Reviewed  Last Vitals:  Filed Vitals:   07/08/14 1600  BP:   Pulse: 66  Temp: 36.7 C  Resp:     Complications: No apparent anesthesia complications

## 2014-07-09 ENCOUNTER — Encounter (HOSPITAL_COMMUNITY): Payer: Self-pay | Admitting: Orthopedic Surgery

## 2014-07-09 LAB — BASIC METABOLIC PANEL
Anion gap: 11 (ref 5–15)
BUN: 16 mg/dL (ref 6–23)
CO2: 25 meq/L (ref 19–32)
Calcium: 8.4 mg/dL (ref 8.4–10.5)
Chloride: 101 mEq/L (ref 96–112)
Creatinine, Ser: 0.84 mg/dL (ref 0.50–1.35)
GFR calc Af Amer: >90 mL/min (ref >90)
GLUCOSE: 207 mg/dL — AB (ref 70–99)
POTASSIUM: 3.9 meq/L (ref 3.7–5.3)
SODIUM: 137 meq/L (ref 137–147)

## 2014-07-09 LAB — GLUCOSE, CAPILLARY
GLUCOSE-CAPILLARY: 176 mg/dL — AB (ref 70–99)
Glucose-Capillary: 126 mg/dL — ABNORMAL HIGH (ref 70–99)

## 2014-07-09 LAB — CBC
HCT: 35.8 % — ABNORMAL LOW (ref 39.0–52.0)
HEMOGLOBIN: 12.2 g/dL — AB (ref 13.0–17.0)
MCH: 31.6 pg (ref 26.0–34.0)
MCHC: 34.1 g/dL (ref 30.0–36.0)
MCV: 92.7 fL (ref 78.0–100.0)
Platelets: 248 10*3/uL (ref 150–400)
RBC: 3.86 MIL/uL — AB (ref 4.22–5.81)
RDW: 12.9 % (ref 11.5–15.5)
WBC: 11.4 10*3/uL — AB (ref 4.0–10.5)

## 2014-07-09 NOTE — Evaluation (Signed)
Occupational Therapy Evaluation Patient Details Name: James Duran MRN: [id number] DOB: 1951/10/26 Today's Date: 07/09/2014    History of Present Illness s/p Rt THA 07/08/14.   Clinical Impression   Pt with decline in function and safety with ADLs and ADL mobility safety. Pt would benefit from acute OT services to address impairments to increase level of function and safety    Follow Up Recommendations  Home health OT    Equipment Recommendations  Other (comment) (ADL A/E kit)    Recommendations for Other Services       Precautions / Restrictions Precautions Precautions: Posterior Hip Precaution Booklet Issued: Yes (comment) Precaution Comments: reviewed hip precautions with pt Required Braces or Orthoses: Other Brace/Splint Other Brace/Splint: hip abduction pillow Restrictions Weight Bearing Restrictions: No      Mobility Bed Mobility Overal bed mobility: Needs Assistance Bed Mobility: Supine to Sit     Supine to sit: Min assist;HOB elevated     General bed mobility comments: pt up in recliner  Transfers Overall transfer level: Needs assistance Equipment used: Rolling walker (2 wheeled) Transfers: Sit to/from Stand Sit to Stand: Min assist         General transfer comment: cues for safe hand placement, hip precautions    Balance Overall balance assessment: Needs assistance Sitting-balance support: No upper extremity supported;Feet supported Sitting balance-Leahy Scale: Good     Standing balance support: Bilateral upper extremity supported;Single extremity supported;During functional activity Standing balance-Leahy Scale: Poor Standing balance comment: relies heavily on RW for UE support                             ADL Overall ADL's : Needs assistance/impaired     Grooming: Wash/dry hands;Min guard;Wash/dry face;Standing   Upper Body Bathing: Set up;Sitting   Lower Body Bathing: Moderate assistance   Upper Body Dressing : Set  up;Sitting   Lower Body Dressing: Moderate assistance   Toilet Transfer: Minimal assistance;Cueing for safety Toilet Transfer Details (indicate cue type and reason): cues for safe hand placement, hip precautions Toileting- Clothing Manipulation and Hygiene: Minimal assistance   Tub/ Shower Transfer: Minimal assistance;Grab bars;3 in 1   Functional mobility during ADLs: Minimal assistance;Cueing for safety General ADL Comments: pt provided wiht ecuation and demo of ADL A/E for home use     Vision  wears reading glasses                   Perception Perception Perception Tested?: No   Praxis Praxis Praxis tested?: Not tested    Pertinent Vitals/Pain Pain Assessment: 0-10 Pain Score: 3  Pain Location: surgical site Pain Descriptors / Indicators: Sore Pain Intervention(s): Limited activity within patient's tolerance;Monitored during session;Repositioned     Hand Dominance Right   Extremity/Trunk Assessment Upper Extremity Assessment Upper Extremity Assessment: Overall WFL for tasks assessed   Lower Extremity Assessment Lower Extremity Assessment: Defer to PT evaluation RLE Deficits / Details: hip 2/5; limited by pain RLE: Unable to fully assess due to pain RLE Sensation:  (wfl to light touch)   Cervical / Trunk Assessment Cervical / Trunk Assessment: Normal   Communication Communication Communication: No difficulties   Cognition Arousal/Alertness: Awake/alert Behavior During Therapy: WFL for tasks assessed/performed Overall Cognitive Status: Within Functional Limits for tasks assessed                     General Comments   Pt very pleasant and cooperative  Home Living Family/patient expects to be discharged to:: Private residence Living Arrangements: Alone Available Help at Discharge: Family Type of Home: House Home Access: Stairs to enter Technical brewer of Steps: 3 Entrance Stairs-Rails: None Home Layout: One  level     Bathroom Shower/Tub: Occupational psychologist: [place]: Toilet riser;Cane - single point;Walker - 2 wheels;Shower seat   Additional Comments: pt has walk in shower with 4" threshold      Prior Functioning/Environment Level of Independence: Independent        Comments: very active     OT Diagnosis: Acute pain   OT Problem List: Decreased knowledge of use of DME or AE;Decreased knowledge of precautions;Decreased activity tolerance;Pain;Impaired balance (sitting and/or standing)   OT Treatment/Interventions: Self-care/ADL training;Therapeutic exercise;Patient/family education;Neuromuscular education;Therapeutic activities;DME and/or AE instruction    OT Goals(Current goals can be found in the care plan section) Acute Rehab OT Goals Patient Stated Goal: home today OT Goal Formulation: With patient Time For Goal Achievement: 07/16/14 Potential to Achieve Goals: Good ADL Goals Pt Will Perform Lower Body Bathing: with min assist Pt Will Perform Lower Body Dressing: with min assist Pt Will Transfer to Toilet: with min guard assist;with supervision;ambulating;grab bars;regular height toilet (3 in 1 over toilet) Pt Will Perform Toileting - Clothing Manipulation and hygiene: with min guard assist;with supervision;sit to/from stand Pt Will Perform Tub/Shower Transfer: with min guard assist;with supervision;shower seat  OT Frequency: Min 2X/week   Barriers to D/C:    none                     End of Session Equipment Utilized During Treatment: Gait belt;Rolling walker;Other (comment) (3 in 1)  Activity Tolerance: Patient tolerated treatment well Patient left: in chair;with call bell/phone within reach   Time: 0910-0937 OT Time Calculation (min): 27 min Charges:  OT General Charges $OT Visit: 1 Procedure OT Evaluation $Initial OT Evaluation Tier I: 1 Procedure OT Treatments $Therapeutic Activity: 8-22 mins G-Codes:    Britt Bottom 07/09/2014, 11:20 AM

## 2014-07-09 NOTE — Discharge Summary (Signed)
Physician Discharge Summary  Patient ID: James Duran MRN: [id number] DOB/AGE: [birth year] 61 y.o.  Admit date: 07/08/2014 Discharge date: 07/09/2014  Admission Diagnoses:  Osteoarthritis of right hip  Discharge Diagnoses:  Principal Problem:   Osteoarthritis of right hip Active Problems:   Hip arthritis   Past Medical History  Diagnosis Date  . Diabetes mellitus without complication   . Arthritis   . Hypertension   . Osteoarthritis of right hip 07/08/2014    Surgeries: Procedure(s): TOTAL HIP ARTHROPLASTY on 07/08/2014   Consultants (if any):    Discharged Condition: Improved  Hospital Course: James Duran is an 63 y.o. male who was admitted 07/08/2014 with a diagnosis of Osteoarthritis of right hip and went to the operating room on 07/08/2014 and underwent the above named procedures.    He was given perioperative antibiotics:  Anti-infectives   Start     Dose/Rate Route Frequency Ordered Stop   07/08/14 2030  ceFAZolin (ANCEF) IVPB 2 g/50 mL premix     2 g 100 mL/hr over 30 Minutes Intravenous Every 6 hours 07/08/14 1925 07/09/14 0141   07/08/14 0600  ceFAZolin (ANCEF) IVPB 2 g/50 mL premix     2 g 100 mL/hr over 30 Minutes Intravenous On call to O.R. 07/07/14 1322 07/08/14 1341    .  He was given sequential compression devices, early ambulation, and xarelto for DVT prophylaxis.  He benefited maximally from the hospital stay and there were no complications.  DC pending today based on passing PT and pain control.  Recent vital signs:  Filed Vitals:   07/09/14 0449  BP: 113/71  Pulse: 72  Temp: 99 F (37.2 C)  Resp: 16    Recent laboratory studies:  Lab Results  Component Value Date   HGB 12.2* 07/09/2014   HGB 14.6 06/27/2014   HGB 16.1 06/21/2010   Lab Results  Component Value Date   WBC 11.4* 07/09/2014   PLT 248 07/09/2014   Lab Results  Component Value Date   INR 0.98 06/27/2014   Lab Results  Component Value Date   NA 137 07/09/2014   K  3.9 07/09/2014   CL 101 07/09/2014   CO2 25 07/09/2014   BUN 16 07/09/2014   CREATININE 0.84 07/09/2014   GLUCOSE 207* 07/09/2014    Discharge Medications:     Medication List    STOP taking these medications       aspirin EC 81 MG EC tablet  Generic drug:  aspirin      TAKE these medications       baclofen 10 MG tablet  Commonly known as:  LIORESAL  Take 1 tablet (10 mg total) by mouth 3 (three) times daily. As needed for muscle spasm     losartan 50 MG tablet  Commonly known as:  COZAAR  Take 50 mg by mouth daily.     metFORMIN 500 MG (MOD) 24 hr tablet  Commonly known as:  GLUMETZA  Take 500 mg by mouth daily with breakfast.     ondansetron 4 MG tablet  Commonly known as:  ZOFRAN  Take 1 tablet (4 mg total) by mouth every 8 (eight) hours as needed for nausea or vomiting.     oxyCODONE-acetaminophen 10-325 MG per tablet  Commonly known as:  PERCOCET  Take 1-2 tablets by mouth every 6 (six) hours as needed for pain. MAXIMUM TOTAL ACETAMINOPHEN DOSE IS 4000 MG PER DAY     pravastatin 10 MG tablet  Commonly known as:  PRAVACHOL  Take 10 mg by mouth daily.     rivaroxaban 10 MG Tabs tablet  Commonly known as:  XARELTO  Take 1 tablet (10 mg total) by mouth daily.     sennosides-docusate sodium 8.6-50 MG tablet  Commonly known as:  SENOKOT-S  Take 2 tablets by mouth daily.     varenicline 1 MG tablet  Commonly known as:  CHANTIX  Take 1 mg by mouth daily.        Diagnostic Studies: Dg Chest 2 View  06/27/2014   CLINICAL DATA:  Preoperative evaluation; hypertension  EXAM: CHEST  2 VIEW  COMPARISON:  June 21, 2010  FINDINGS: There is a questionable nipple shadow on the left. There is no edema or consolidation. The heart size and pulmonary vascularity are normal. No adenopathy. No bone lesions. Multiple small metallic foreign bodies are either in or overlying the lower cervical region.  IMPRESSION: No edema or consolidation. Questionable nipple shadow on the left;  advise repeat study with nipple markers to further assess.   Electronically Signed   By: Bretta Bang M.D.   On: 06/27/2014 09:18   Dg Pelvis Portable  07/08/2014   CLINICAL DATA:  Status post right total hip arthroplasty.  EXAM: PORTABLE PELVIS 1-2 VIEWS  COMPARISON:  None.  FINDINGS: Status post right total hip arthroplasty. The femoral and acetabular components appear well situated. No fracture or dislocation is noted. Moderate degenerative joint disease of the left hip is noted.  IMPRESSION: Status post right total hip arthroplasty.   Electronically Signed   By: Roque Lias M.D.   On: 07/08/2014 16:49   Dg Hip Portable 1 View Right  07/08/2014   CLINICAL DATA:  Postop right hip arthroplasty.  EXAM: PORTABLE RIGHT HIP - 1 VIEW  COMPARISON:  Current AP view of the right hip arthroplasty.  FINDINGS: Right hip arthroplasty is well aligned. There is no acute fracture or evidence of an operative complication.  IMPRESSION: Well-aligned right hip arthroplasty.   Electronically Signed   By: Amie Portland M.D.   On: 07/08/2014 16:50    Disposition: home        Follow-up Information   Follow up with Eulas Post, MD. Schedule an appointment as soon as possible for a visit in 2 weeks.   Specialty:  Orthopedic Surgery   Contact information:   [address] [place] [address] [phone]        Signed: Eulas Post 07/09/2014, 8:35 AM

## 2014-07-09 NOTE — Care Management Note (Signed)
CARE MANAGEMENT NOTE 07/09/2014  Patient:  James Duran, James Duran   Account Number:  [id number]  Date Initiated:  07/09/2014  Documentation initiated by:  Vance Peper  Subjective/Objective Assessment:   63 yr old male admitted with DJD of right hip, s/p right hip arthroplasty.     Action/Plan:   Case Manager spoke with patient concerning home health and DME needs at discharge. Patient preoperatively setup with [facility], no changes. Patient has family support at discharge.   Anticipated DC Date:  07/09/2014   Anticipated DC Plan:  HOME W HOME HEALTH SERVICES      DC Planning Services  CM consult      [facility] Choice  HOME HEALTH  DURABLE MEDICAL EQUIPMENT   Choice offered to / List presented to:  C-1 Patient   DME arranged  [facility]  3-N-1  CPM      DME agency  [employer]     HH arranged  HH-2 PT      HH agency  [facility]   Status of service:  Completed, signed off Medicare Important Message given?   (If response is "NO", the following Medicare IM given date fields will be blank) Date Medicare IM given:   Medicare IM given by:   Date Additional Medicare IM given:   Additional Medicare IM given by:    Discharge Disposition:  HOME W HOME HEALTH SERVICES  Per UR Regulation:  Reviewed for med. necessity/level of care/duration of stay  If discussed at Long Length of Stay Meetings, dates discussed:    Comments:

## 2014-07-09 NOTE — Progress Notes (Signed)
Utilization review completed.  

## 2014-07-09 NOTE — Progress Notes (Signed)
Physical Therapy Treatment Patient Details Name: James Duran MRN: [id number] DOB: May 22, 1951 Today's Date: 07/09/2014    History of Present Illness s/p Rt THA 07/08/14.    PT Comments    Pt seen for stair education, to increase mobilization and to educate on HEP. Pt safe from mobility standpoint to D/C home with 24/7 (A) by family. Recommend HHPT for therapy upon acute D/C.  Follow Up Recommendations  Home health PT;Supervision/Assistance - 24 hour     Equipment Recommendations  None recommended by PT    Recommendations for Other Services OT consult     Precautions / Restrictions Precautions Precautions: Posterior Hip Precaution Booklet Issued: Yes (comment) Precaution Comments: pt able to recall precautions; min cues during session for precautions  Required Braces or Orthoses: Other Brace/Splint Other Brace/Splint: hip abduction pillow Restrictions Weight Bearing Restrictions: No RLE Weight Bearing: Weight bearing as tolerated    Mobility  Bed Mobility Overal bed mobility: Needs Assistance Bed Mobility: Supine to Sit     Supine to sit: Min assist;HOB elevated     General bed mobility comments: reviewed technique with sheet for bed mobility; in recliner and returned to recliner   Transfers Overall transfer level: Needs assistance Equipment used: Rolling walker (2 wheeled) Transfers: Sit to/from Stand Sit to Stand: Supervision         General transfer comment: supervision for min cues for hand placement and sequencing and to maintain hip precautions   Ambulation/Gait Ambulation/Gait assistance: Supervision Ambulation Distance (Feet): 400 Feet Assistive device: Rolling walker (2 wheeled) Gait Pattern/deviations: Step-through pattern;Antalgic;Wide base of support Gait velocity: decreased Gait velocity interpretation: Below normal speed for age/gender General Gait Details: pt progressed to step through gt; cues for upright posture and proper techinque with  RW; decr speed due to pain    Stairs Stairs: Yes Stairs assistance: Supervision Stair Management: One rail Right;Step to pattern;Sideways Number of Stairs: 3 General stair comments: cues and demo for safety and sequencing  Wheelchair Mobility    Modified Rankin (Stroke Patients Only)       Balance Overall balance assessment: No apparent balance deficits (not formally assessed) Sitting-balance support: Feet supported;No upper extremity supported Sitting balance-Leahy Scale: Good     Standing balance support: During functional activity;Bilateral upper extremity supported Standing balance-Leahy Scale: Poor Standing balance comment: relies on RW for UE support                     Cognition Arousal/Alertness: Awake/alert Behavior During Therapy: WFL for tasks assessed/performed Overall Cognitive Status: Within Functional Limits for tasks assessed                      Exercises Total Joint Exercises Ankle Circles/Pumps: AROM;Both;10 reps;Supine Quad Sets: AROM;Strengthening;Both;10 reps;Seated Short Arc Quad: AROM;Strengthening;Right;10 reps;Seated Heel Slides: AAROM;Right;10 reps;Other (comment) (with UE support) Hip ABduction/ADduction: AAROM;Strengthening;Right;10 reps;Seated;Other (comment) (with UE support ) Long Arc Quad: AROM;Strengthening;Both;10 reps;Seated    General Comments General comments (skin integrity, edema, etc.): reviewed HEP with pt       Pertinent Vitals/Pain Pain Assessment: No/denies pain Pain Score: 3  Pain Location: surgical site Pain Descriptors / Indicators: Sore (c/o soreness ) Pain Intervention(s): Repositioned;Monitored during session    [place] expects to be discharged to:: Private residence Living Arrangements: Alone Available Help at Discharge: Family Type of Home: House Home Access: Stairs to enter Entrance Stairs-Rails: None Home Layout: One level Home Equipment: Toilet riser;Cane - single  point;Walker - 2 wheels;Shower seat Additional Comments: pt has  walk in shower with 4" threshold    Prior Function Level of Independence: Independent      Comments: very active    PT Goals (current goals can now be found in the care plan section) Acute Rehab PT Goals Patient Stated Goal: home after this  PT Goal Formulation: With patient Time For Goal Achievement: 07/11/14 Potential to Achieve Goals: Good Progress towards PT goals: Goals met/education completed, patient discharged from PT    Frequency  7X/week    PT Plan Current plan remains appropriate    Co-evaluation             End of Session Equipment Utilized During Treatment: Gait belt Activity Tolerance: Patient tolerated treatment well Patient left: in chair;with call bell/phone within reach     Time: 1117-1141 PT Time Calculation (min): 24 min  Charges:  $Gait Training: 8-22 mins $Therapeutic Exercise: 8-22 mins                    G CodesGustavus Bryant, [place] 07/09/2014, 12:51 PM

## 2014-07-09 NOTE — Evaluation (Signed)
Physical Therapy Evaluation Patient Details Name: James Duran MRN: [id number] DOB: 1951-11-10 Today's Date: 07/09/2014   History of Present Illness  s/p Rt THA 07/08/14.  Clinical Impression  Pt is s/p Rt THA POD#1 resulting in the deficits listed below (see PT Problem List).  Pt will benefit from skilled PT to increase their independence and safety with mobility to allow discharge to the venue listed below. Pt progressing well during evaluation. Continues to require (A) for Rt LE advancement due to weakness and pain. Patient needs to practice stairs next session prior to D/C home. Pt hopeful to D/C home today.        Follow Up Recommendations Home health PT;Supervision/Assistance - 24 hour    Equipment Recommendations  None recommended by PT    Recommendations for Other Services OT consult     Precautions / Restrictions Precautions Precautions: Posterior Hip Precaution Booklet Issued: Yes (comment) Precaution Comments: pt educated on hip precautions  Required Braces or Orthoses: Other Brace/Splint Other Brace/Splint: hip abduction pillow Restrictions Weight Bearing Restrictions: No      Mobility  Bed Mobility Overal bed mobility: Needs Assistance Bed Mobility: Supine to Sit     Supine to sit: Min assist;HOB elevated     General bed mobility comments: (A) to advance Rt LE off EOB; cues for hand placement and sequencing  Transfers Overall transfer level: Needs assistance Equipment used: Rolling walker (2 wheeled) Transfers: Sit to/from Stand Sit to Stand: Min assist         General transfer comment: (A) to achieve upright standing position; cues for hand placement and safety with transfers to avoid bending > 90degrees   Ambulation/Gait Ambulation/Gait assistance: Min guard Ambulation Distance (Feet): 100 Feet Assistive device: Rolling walker (2 wheeled) Gait Pattern/deviations: Step-to pattern;Decreased stance time - right;Decreased step length -  left;Antalgic Gait velocity: decreased Gait velocity interpretation: Below normal speed for age/gender General Gait Details: cues for gt sequencing and RW safety; min guard to steady; progressing slowly towards step through gt; limited due to pain   Stairs            Wheelchair Mobility    Modified Rankin (Stroke Patients Only)       Balance Overall balance assessment: Needs assistance Sitting-balance support: Feet supported;No upper extremity supported Sitting balance-Leahy Scale: Fair     Standing balance support: During functional activity;Bilateral upper extremity supported Standing balance-Leahy Scale: Poor Standing balance comment: relies heavily on RW for UE support                              Pertinent Vitals/Pain Pain Assessment: 0-10 Pain Score: 4  Pain Location: surgical site Pain Descriptors / Indicators: Numbness;Operative site guarding;Throbbing Pain Intervention(s): Limited activity within patient's tolerance;Repositioned;Monitored during session    Home Living Family/patient expects to be discharged to:: Private residence Living Arrangements: Spouse/significant other;Children Available Help at Discharge: Family;Available 24 hours/day Type of Home: House Home Access: Stairs to enter Entrance Stairs-Rails: None Entrance Stairs-Number of Steps: 3 Home Layout: One level Home Equipment: Walker - 2 wheels;Shower seat Additional Comments: pt has walk in shower with 4" threshold    Prior Function Level of Independence: Independent         Comments: very active      Hand Dominance        Extremity/Trunk Assessment   Upper Extremity Assessment: Defer to OT evaluation           Lower Extremity Assessment: RLE  deficits/detail RLE Deficits / Details: hip 2/5; limited by pain    Cervical / Trunk Assessment: Normal  Communication   Communication: No difficulties  Cognition Arousal/Alertness: Awake/alert Behavior During  Therapy: WFL for tasks assessed/performed Overall Cognitive Status: Within Functional Limits for tasks assessed                      General Comments      Exercises Total Joint Exercises Ankle Circles/Pumps: AROM;Both;10 reps;Supine Long Arc Quad: AROM;Strengthening;Both;10 reps;Seated      Assessment/Plan    PT Assessment Patient needs continued PT services  PT Diagnosis Abnormality of gait;Generalized weakness;Acute pain   PT Problem List Decreased strength;Decreased activity tolerance;Decreased range of motion;Decreased balance;Decreased mobility;Decreased knowledge of use of DME;Decreased safety awareness;Decreased knowledge of precautions;Pain  PT Treatment Interventions Stair training;Gait training;DME instruction;Functional mobility training;Therapeutic activities;Therapeutic exercise;Balance training;Neuromuscular re-education;Patient/family education   PT Goals (Current goals can be found in the Care Plan section) Acute Rehab PT Goals Patient Stated Goal: home today PT Goal Formulation: With patient Time For Goal Achievement: 07/11/14 Potential to Achieve Goals: Good    Frequency 7X/week   Barriers to discharge        Co-evaluation               End of Session Equipment Utilized During Treatment: Gait belt Activity Tolerance: Patient tolerated treatment well Patient left: in chair;with call bell/phone within reach Nurse Communication: Mobility status;Precautions         Time: 4098-1191 PT Time Calculation (min): 20 min   Charges:   PT Evaluation $Initial PT Evaluation Tier I: 1 Procedure PT Treatments $Gait Training: 8-22 mins   PT G CodesDonell Sievert, [place]  [phone] 07/09/2014, 9:21 AM

## 2014-07-09 NOTE — Progress Notes (Signed)
Patient ID: James Duran, male   DOB: [birth year], 63 y.o.   MRN: [id number]     Subjective:  Patient reports pain as mild to moderate.  Patient doing okay  Denies CP or SOB  Objective:   VITALS:   Filed Vitals:   07/08/14 2038 07/09/14 0000 07/09/14 0331 07/09/14 0449  BP: 143/82   113/71  Pulse: 87   72  Temp: 98.7 F (37.1 C)   99 F (37.2 C)  TempSrc: Oral     Resp: Weight:      SpO2: 98% 99% 100% 99%    ABD soft Sensation intact distally Dorsiflexion/Plantar flexion intact Incision: dressing C/D/I and scant drainage Good knee, ankle,and foot function  Lab Results  Component Value Date   WBC 11.4* 07/09/2014   HGB 12.2* 07/09/2014   HCT 35.8* 07/09/2014   MCV 92.7 07/09/2014   PLT 248 07/09/2014   BMET    Component Value Date/Time   NA 137 07/09/2014 0454   K 3.9 07/09/2014 0454   CL 101 07/09/2014 0454   CO2 25 07/09/2014 0454   GLUCOSE 207* 07/09/2014 0454   BUN 16 07/09/2014 0454   CREATININE 0.84 07/09/2014 0454   CALCIUM 8.4 07/09/2014 0454   GFRNONAA >90 07/09/2014 0454   GFRAA >90 07/09/2014 0454     Assessment/Plan: 1 Day Post-Op   Principal Problem:   Osteoarthritis of right hip Active Problems:   Hip arthritis   Advance diet Up with therapy Plan for discharge tomorrow vs. Today based on PT and pain control. WBAT Dry dressing PRN    Haskel Khan 07/09/2014, 8:20 AM  Seen and agree.  Teryl Lucy, MD Cell [phone]

## 2015-11-22 IMAGING — CR DG CHEST 2V
2 series · 2 of 2 positions shown · non-contrast
Comparison: June 21, 2010

CLINICAL DATA: Preoperative evaluation; hypertension

EXAM:
CHEST  2 VIEW

[w chest pa]
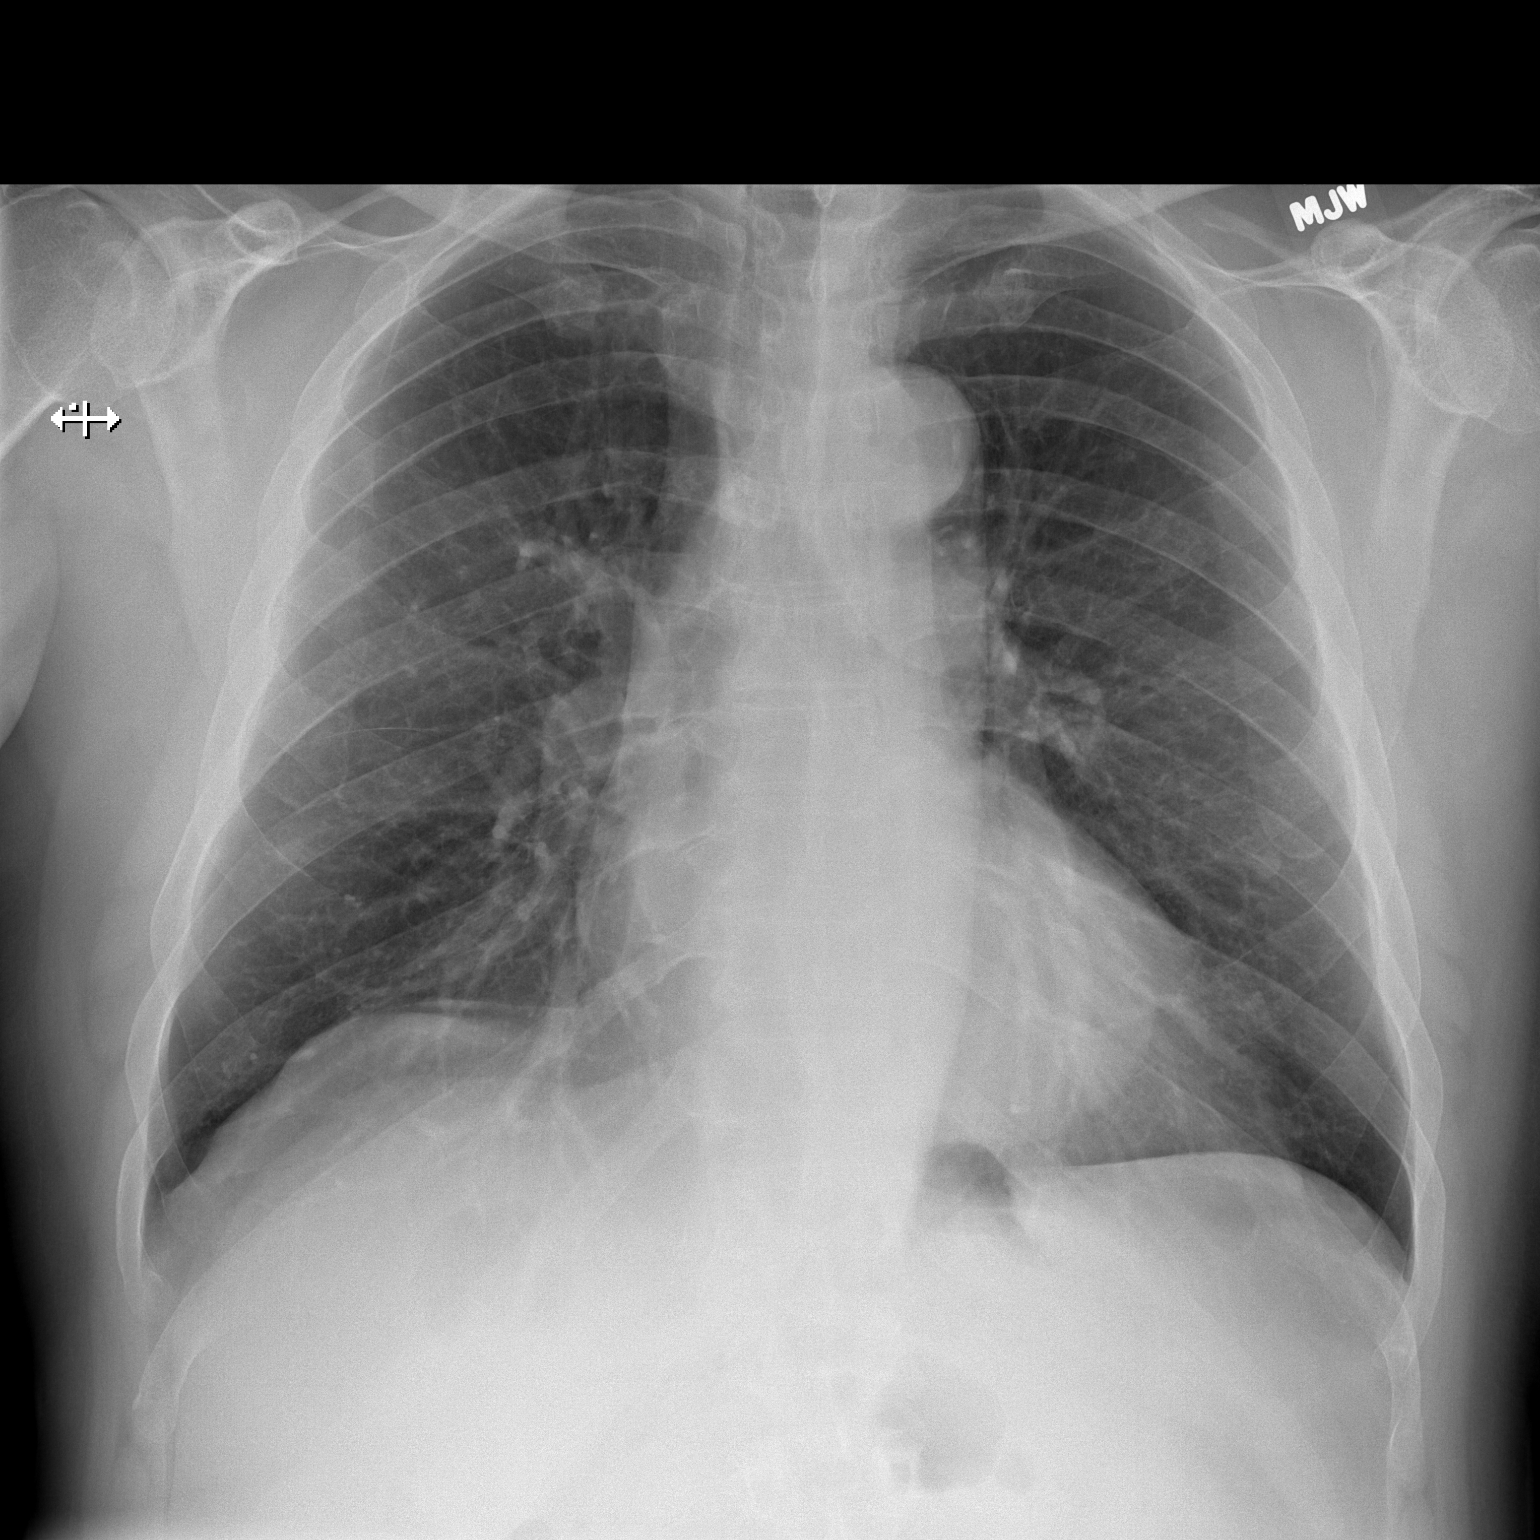

[w chest lat]
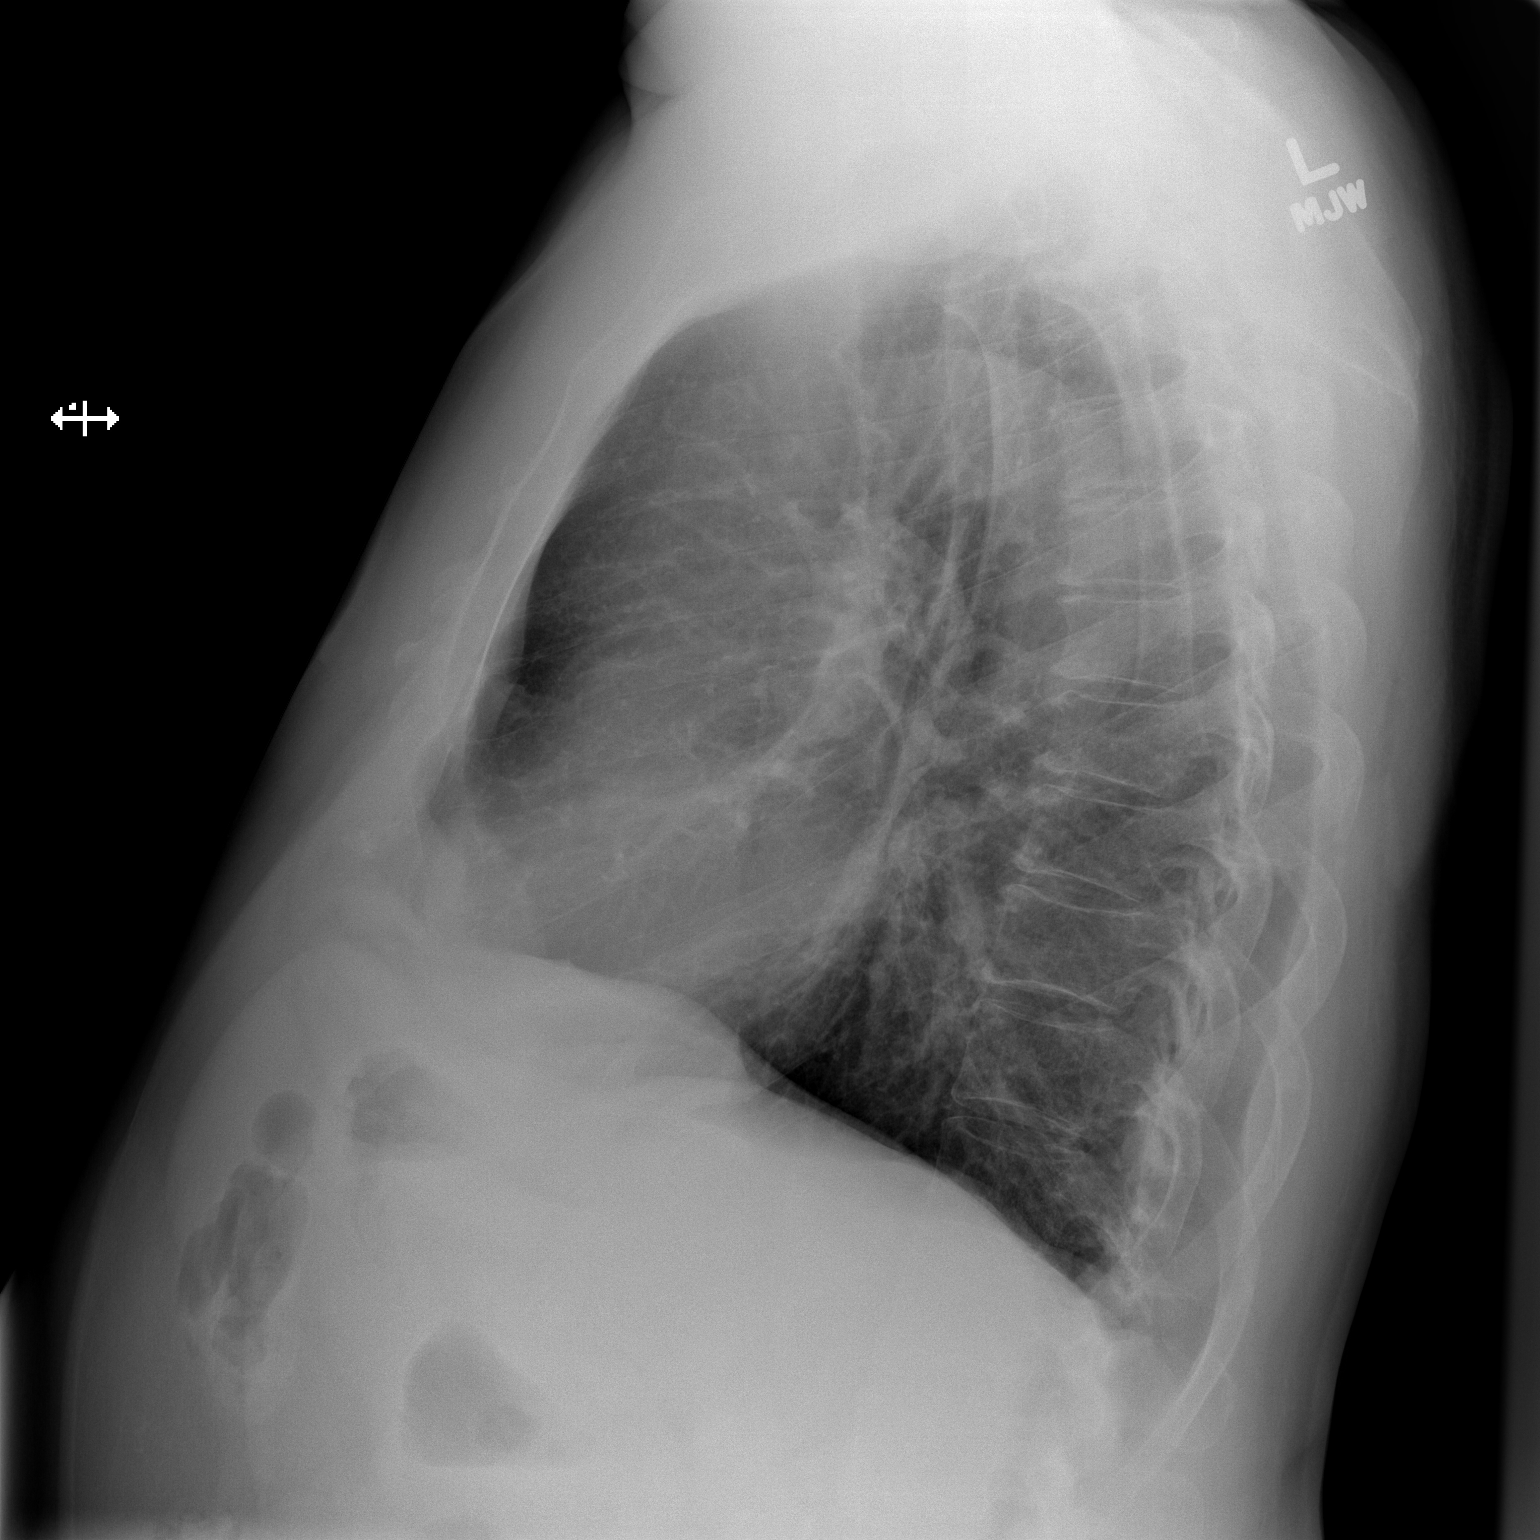

[2 of 2 positions shown; findings below may reference images not displayed]

FINDINGS: There is a questionable nipple shadow on the left. There is no edema
or consolidation. The heart size and pulmonary vascularity are
normal. No adenopathy. No bone lesions. Multiple small metallic
foreign bodies are either in or overlying the lower cervical region.
IMPRESSION: No edema or consolidation. Questionable nipple shadow on the left;
advise repeat study with nipple markers to further assess.

## 2018-11-05 NOTE — Progress Notes (Signed)
Please provide pre-op orders for the patient.

## 2018-11-05 NOTE — Patient Instructions (Addendum)
James Duran  11/05/2018   Your procedure is scheduled on: 11-20-18   [facility]    Report to Admitting at 11:30 AM    Call this number if you have problems the morning of surgery [phone]    Remember: Do not eat food or drink liquids :After Midnight. You may have a Clear Liquid Diet from Midnight until 8:00 AM. After 8:00 AM, nothing until after surgery.     CLEAR LIQUID DIET   Foods Allowed                                                                     Foods Excluded  Coffee and tea, regular and decaf                             liquids that you cannot  Plain Jell-O in any flavor                                             see through such as: Fruit ices (not with fruit pulp)                                     milk, soups, orange juice  Iced Popsicles                                    All solid food Carbonated beverages, regular and diet                                    Cranberry, grape and apple juices Sports drinks like Gatorade Lightly seasoned clear broth or consume(fat free) Sugar, honey syrup  Sample Menu Breakfast                                Lunch                                     Supper Cranberry juice                    Beef broth                            Chicken broth Jell-O                                     Grape juice  Apple juice Coffee or tea                        Jell-O                                      Popsicle                                                Coffee or tea                        Coffee or tea  _____________________________________________________________________   BRUSH YOUR TEETH MORNING OF SURGERY AND RINSE YOUR MOUTH OUT, NO CHEWING GUM CANDY OR MINTS.     Take these medicines the morning of surgery with A SIP OF WATER: Atorvastatin (Lipitor) DO NOT TAKE ANY DIABETIC MEDICATIONS DAY OF YOUR SURGERY                               You  may not have any metal on your body including hair pins and              piercings  Do not wear jewelry, cologne, lotions, powders or deodorant             Men may shave face and neck.   Do not bring valuables to the hospital. [facility] IS NOT             RESPONSIBLE   FOR VALUABLES.  Contacts, dentures or bridgework may not be worn into surgery.  Leave suitcase in the car. After surgery it may be brought to your room.      Special Instructions: N/A              Please read over the following fact sheets you were given: _____________________________________________________________________  How to Manage Your Diabetes Before and After Surgery  Why is it important to control my blood sugar before and after surgery? . Improving blood sugar levels before and after surgery helps healing and can limit problems. . A way of improving blood sugar control is eating a healthy diet by: o  Eating less sugar and carbohydrates o  Increasing activity/exercise o  Talking with your doctor about reaching your blood sugar goals . High blood sugars (greater than 180 mg/dL) can raise your risk of infections and slow your recovery, so you will need to focus on controlling your diabetes during the weeks before surgery. . Make sure that the doctor who takes care of your diabetes knows about your planned surgery including the date and location.  How do I manage my blood sugar before surgery? . Check your blood sugar at least 4 times a day, starting 2 days before surgery, to make sure that the level is not too high or low. o Check your blood sugar the morning of your surgery when you wake up and every 2 hours until you get to the Short Stay unit. . If your blood sugar is less than 70 mg/dL, you will need to treat for low blood sugar: o Do not take insulin. o Treat a low blood sugar (less than 70 mg/dL) with  cup of clear  juice (cranberry or apple), 4 glucose tablets, OR glucose gel. o Recheck blood sugar in  15 minutes after treatment (to make sure it is greater than 70 mg/dL). If your blood sugar is not greater than 70 mg/dL on recheck, call [phone] for further instructions. . Report your blood sugar to the short stay nurse when you get to Short Stay.  . If you are admitted to the hospital after surgery: o Your blood sugar will be checked by the staff and you will probably be given insulin after surgery (instead of oral diabetes medicines) to make sure you have good blood sugar levels. o The goal for blood sugar control after surgery is 80-180 mg/dL.   WHAT DO I DO ABOUT MY DIABETES MEDICATION?  Marland Kitchen Do not take oral diabetes medicines (pills) the morning of surgery.  . THE DAY BEFORE SURGERY, take your usual dose of Metformin         Reviewed and Endorsed by [facility] Patient Education Committee, August 2015          [facility] - Preparing for Surgery Before surgery, you can play an important role.  Because skin is not sterile, your skin needs to be as free of germs as possible.  You can reduce the number of germs on your skin by washing with CHG (chlorahexidine gluconate) soap before surgery.  CHG is an antiseptic cleaner which kills germs and bonds with the skin to continue killing germs even after washing. Please DO NOT use if you have an allergy to CHG or antibacterial soaps.  If your skin becomes reddened/irritated stop using the CHG and inform your nurse when you arrive at Short Stay. Do not shave (including legs and underarms) for at least 48 hours prior to the first CHG shower.  You may shave your face/neck. Please follow these instructions carefully:  1.  Shower with CHG Soap the night before surgery and the  morning of Surgery.  2.  If you choose to wash your hair, wash your hair first as usual with your  normal  shampoo.  3.  After you shampoo, rinse your hair and body thoroughly to remove the  shampoo.                           4.  Use CHG as you would any other liquid soap.   You can apply chg directly  to the skin and wash                       Gently with a scrungie or clean washcloth.  5.  Apply the CHG Soap to your body ONLY FROM THE NECK DOWN.   Do not use on face/ open                           Wound or open sores. Avoid contact with eyes, ears mouth and genitals (private parts).                       Wash face,  Genitals (private parts) with your normal soap.             6.  Wash thoroughly, paying special attention to the area where your surgery  will be performed.  7.  Thoroughly rinse your body with warm water from the neck down.  8.  DO NOT shower/wash with your normal soap after  using and rinsing off  the CHG Soap.                9.  Pat yourself dry with a clean towel.            10.  Wear clean pajamas.            11.  Place clean sheets on your bed the night of your first shower and do not  sleep with pets. Day of Surgery : Do not apply any lotions/deodorants the morning of surgery.  Please wear clean clothes to the hospital/surgery center.  FAILURE TO FOLLOW THESE INSTRUCTIONS MAY RESULT IN THE CANCELLATION OF YOUR SURGERY PATIENT SIGNATURE_________________________________  NURSE SIGNATURE__________________________________  ________________________________________________________________________   James Duran  An incentive spirometer is a tool that can help keep your lungs clear and active. This tool measures how well you are filling your lungs with each breath. Taking long deep breaths may help reverse or decrease the chance of developing breathing (pulmonary) problems (especially infection) following:  A long period of time when you are unable to move or be active. BEFORE THE PROCEDURE   If the spirometer includes an indicator to show your best effort, your nurse or respiratory therapist will set it to a desired goal.  If possible, sit up straight or lean slightly forward. Try not to slouch.  Hold the incentive spirometer in an  upright position. INSTRUCTIONS FOR USE  1. Sit on the edge of your bed if possible, or sit up as far as you can in bed or on a chair. 2. Hold the incentive spirometer in an upright position. 3. Breathe out normally. 4. Place the mouthpiece in your mouth and seal your lips tightly around it. 5. Breathe in slowly and as deeply as possible, raising the piston or the ball toward the top of the column. 6. Hold your breath for 3-5 seconds or for as long as possible. Allow the piston or ball to fall to the bottom of the column. 7. Remove the mouthpiece from your mouth and breathe out normally. 8. Rest for a few seconds and repeat Steps 1 through 7 at least 10 times every 1-2 hours when you are awake. Take your time and take a few normal breaths between deep breaths. 9. The spirometer may include an indicator to show your best effort. Use the indicator as a goal to work toward during each repetition. 10. After each set of 10 deep breaths, practice coughing to be sure your lungs are clear. If you have an incision (the cut made at the time of surgery), support your incision when coughing by placing a pillow or rolled up towels firmly against it. Once you are able to get out of bed, walk around indoors and cough well. You may stop using the incentive spirometer when instructed by your caregiver.  RISKS AND COMPLICATIONS  Take your time so you do not get dizzy or light-headed.  If you are in pain, you may need to take or ask for pain medication before doing incentive spirometry. It is harder to take a deep breath if you are having pain. AFTER USE  Rest and breathe slowly and easily.  It can be helpful to keep track of a log of your progress. Your caregiver can provide you with a simple table to help with this. If you are using the spirometer at home, follow these instructions: SEEK MEDICAL CARE IF:   You are having difficultly using the spirometer.  You have trouble  using the spirometer as often as  instructed.  Your pain medication is not giving enough relief while using the spirometer.  You develop fever of 100.5 F (38.1 C) or higher. SEEK IMMEDIATE MEDICAL CARE IF:   You cough up bloody sputum that had not been present before.  You develop fever of 102 F (38.9 C) or greater.  You develop worsening pain at or near the incision site. MAKE SURE YOU:   Understand these instructions.  Will watch your condition.  Will get help right away if you are not doing well or get worse. Document Released: 03/13/2007 Document Revised: 01/23/2012 Document Reviewed: 05/14/2007 [facility] Patient Information 2014 [employer] [place] [place].   ________________________________________________________________________

## 2018-11-13 ENCOUNTER — Encounter (HOSPITAL_COMMUNITY): Payer: Self-pay

## 2018-11-13 ENCOUNTER — Encounter (HOSPITAL_COMMUNITY)
Admission: RE | Admit: 2018-11-13 | Discharge: 2018-11-13 | Disposition: A | Discharge status: Home / Self Care | Payer: Medicare Other | Source: Ambulatory Visit | Attending: Orthopedic Surgery | Admitting: Orthopedic Surgery

## 2018-11-13 ENCOUNTER — Other Ambulatory Visit: Payer: Self-pay

## 2018-11-13 DIAGNOSIS — Z01812 Encounter for preprocedural laboratory examination: Secondary | ICD-10-CM | POA: Diagnosis present

## 2018-11-13 LAB — BASIC METABOLIC PANEL
Anion gap: 8 (ref 5–15)
BUN: 20 mg/dL (ref 8–23)
CO2: 25 mmol/L (ref 22–32)
Calcium: 9 mg/dL (ref 8.9–10.3)
Chloride: 106 mmol/L (ref 98–111)
Creatinine, Ser: 0.93 mg/dL (ref 0.61–1.24)
GFR calc Af Amer: >60 mL/min (ref >60)
GFR calc non Af Amer: >60 mL/min (ref >60)
Glucose, Bld: 157 mg/dL — ABNORMAL HIGH (ref 70–99)
Potassium: 4.3 mmol/L (ref 3.5–5.1)
Sodium: 139 mmol/L (ref 135–145)

## 2018-11-13 LAB — CBC
HCT: 45.8 % (ref 39.0–52.0)
Hemoglobin: 15.3 g/dL (ref 13.0–17.0)
MCH: 32.1 pg (ref 26.0–34.0)
MCHC: 33.4 g/dL (ref 30.0–36.0)
MCV: 96.2 fL (ref 80.0–100.0)
Platelets: 276 10*3/uL (ref 150–400)
RBC: 4.76 MIL/uL (ref 4.22–5.81)
RDW: 12.6 % (ref 11.5–15.5)
WBC: 9.3 10*3/uL (ref 4.0–10.5)
nRBC: 0 % (ref 0.0–0.2)

## 2018-11-13 LAB — SURGICAL PCR SCREEN
MRSA, PCR: NEGATIVE
STAPHYLOCOCCUS AUREUS: NEGATIVE

## 2018-11-13 LAB — HEMOGLOBIN A1C
Hgb A1c MFr Bld: 7 % — ABNORMAL HIGH (ref 4.8–5.6)
Mean Plasma Glucose: 154.2 mg/dL

## 2018-11-13 LAB — GLUCOSE, CAPILLARY: Glucose-Capillary: 174 mg/dL — ABNORMAL HIGH (ref 70–99)

## 2018-11-13 NOTE — Progress Notes (Signed)
12-06-17  EKG on chart 

## 2018-11-16 ENCOUNTER — Other Ambulatory Visit: Payer: Self-pay | Admitting: Orthopedic Surgery

## 2018-11-16 NOTE — Care Plan (Signed)
Spoke with patient prior to surgery. He will discharge to home with family and HHPT. He has all needed equipment at home. OPPT set up if needed.   Shauna Hugh, RNCM  [phone]

## 2018-11-19 ENCOUNTER — Other Ambulatory Visit: Payer: Self-pay | Admitting: Orthopedic Surgery

## 2018-11-20 ENCOUNTER — Encounter (HOSPITAL_COMMUNITY)
Admission: RE | Disposition: A | Discharge status: Home / Self Care | Payer: Self-pay | Source: Home / Self Care | Attending: Orthopedic Surgery

## 2018-11-20 ENCOUNTER — Inpatient Hospital Stay (HOSPITAL_COMMUNITY): Payer: Medicare Other | Admitting: Certified Registered Nurse Anesthetist

## 2018-11-20 ENCOUNTER — Inpatient Hospital Stay (HOSPITAL_COMMUNITY): Payer: Medicare Other

## 2018-11-20 ENCOUNTER — Encounter (HOSPITAL_COMMUNITY): Payer: Self-pay | Admitting: Certified Registered Nurse Anesthetist

## 2018-11-20 ENCOUNTER — Other Ambulatory Visit: Payer: Self-pay

## 2018-11-20 ENCOUNTER — Inpatient Hospital Stay (HOSPITAL_COMMUNITY): Payer: Medicare Other | Admitting: Physician Assistant

## 2018-11-20 ENCOUNTER — Observation Stay (HOSPITAL_COMMUNITY)
Admission: RE | Admit: 2018-11-20 | Discharge: 2018-11-21 | Disposition: A | Discharge status: Home / Self Care | Payer: Medicare Other | Attending: Orthopedic Surgery | Admitting: Orthopedic Surgery

## 2018-11-20 DIAGNOSIS — F17290 Nicotine dependence, other tobacco product, uncomplicated: Secondary | ICD-10-CM | POA: Diagnosis not present

## 2018-11-20 DIAGNOSIS — Z96641 Presence of right artificial hip joint: Secondary | ICD-10-CM | POA: Insufficient documentation

## 2018-11-20 DIAGNOSIS — E119 Type 2 diabetes mellitus without complications: Secondary | ICD-10-CM | POA: Diagnosis not present

## 2018-11-20 DIAGNOSIS — E669 Obesity, unspecified: Secondary | ICD-10-CM | POA: Diagnosis not present

## 2018-11-20 DIAGNOSIS — Z96642 Presence of left artificial hip joint: Secondary | ICD-10-CM

## 2018-11-20 DIAGNOSIS — I10 Essential (primary) hypertension: Secondary | ICD-10-CM | POA: Insufficient documentation

## 2018-11-20 DIAGNOSIS — Z7984 Long term (current) use of oral hypoglycemic drugs: Secondary | ICD-10-CM | POA: Insufficient documentation

## 2018-11-20 DIAGNOSIS — M1612 Unilateral primary osteoarthritis, left hip: Principal | ICD-10-CM | POA: Diagnosis present

## 2018-11-20 DIAGNOSIS — Z79899 Other long term (current) drug therapy: Secondary | ICD-10-CM | POA: Insufficient documentation

## 2018-11-20 DIAGNOSIS — Z6833 Body mass index (BMI) 33.0-33.9, adult: Secondary | ICD-10-CM | POA: Diagnosis not present

## 2018-11-20 DIAGNOSIS — Z96649 Presence of unspecified artificial hip joint: Secondary | ICD-10-CM

## 2018-11-20 DIAGNOSIS — M25552 Pain in left hip: Secondary | ICD-10-CM | POA: Diagnosis present

## 2018-11-20 HISTORY — DX: Unilateral primary osteoarthritis, left hip: M16.12

## 2018-11-20 HISTORY — PX: TOTAL HIP ARTHROPLASTY: SHX124

## 2018-11-20 LAB — GLUCOSE, CAPILLARY
GLUCOSE-CAPILLARY: 134 mg/dL — AB (ref 70–99)
Glucose-Capillary: 167 mg/dL — ABNORMAL HIGH (ref 70–99)
Glucose-Capillary: 111 mg/dL — ABNORMAL HIGH (ref 70–99)
Glucose-Capillary: 180 mg/dL — ABNORMAL HIGH (ref 70–99)

## 2018-11-20 SURGERY — ARTHROPLASTY, HIP, TOTAL,POSTERIOR APPROACH
Anesthesia: General | Site: Hip | Laterality: Left

## 2018-11-20 MED ORDER — METOCLOPRAMIDE HCL 5 MG/ML IJ SOLN: 5.0000 mg | Freq: Three times a day (TID) | INTRAMUSCULAR | Status: DC | PRN

## 2018-11-20 MED ORDER — POTASSIUM CHLORIDE IN NACL 20-0.45 MEQ/L-% IV SOLN
INTRAVENOUS | Status: DC
  Administered 2018-11-20 – 2018-11-21 (×2): via INTRAVENOUS
  Filled 2018-11-20 (×2): qty 1000

## 2018-11-20 MED ORDER — PHENYLEPHRINE 40 MCG/ML (10ML) SYRINGE FOR IV PUSH (FOR BLOOD PRESSURE SUPPORT)
PREFILLED_SYRINGE | INTRAVENOUS | Status: DC | PRN
  Administered 2018-11-20: 120 ug via INTRAVENOUS
  Administered 2018-11-20 (×3): 80 ug via INTRAVENOUS
  Administered 2018-11-20: 120 ug via INTRAVENOUS

## 2018-11-20 MED ORDER — BUPIVACAINE HCL (PF) 0.25 % IJ SOLN
INTRAMUSCULAR | Status: DC | PRN
  Administered 2018-11-20: 30 mL

## 2018-11-20 MED ORDER — CEFAZOLIN SODIUM-DEXTROSE 2-4 GM/100ML-% IV SOLN
2.0000 g | Freq: Four times a day (QID) | INTRAVENOUS | Status: AC
  Administered 2018-11-20 (×2): 2 g via INTRAVENOUS
  Filled 2018-11-20 (×2): qty 100

## 2018-11-20 MED ORDER — METFORMIN HCL 500 MG PO TABS
500.0000 mg | ORAL_TABLET | Freq: Every day | ORAL | Status: DC
  Administered 2018-11-21: 500 mg via ORAL
  Filled 2018-11-20: qty 1

## 2018-11-20 MED ORDER — BACLOFEN 10 MG PO TABS: 10.0000 mg | ORAL_TABLET | Freq: Three times a day (TID) | ORAL | 0 refills | Status: AC

## 2018-11-20 MED ORDER — ONDANSETRON HCL 4 MG/2ML IJ SOLN: 4.0000 mg | Freq: Once | INTRAMUSCULAR | Status: DC | PRN

## 2018-11-20 MED ORDER — PHENYLEPHRINE HCL 10 MG/ML IJ SOLN
INTRAMUSCULAR | Status: AC
  Filled 2018-11-20: qty 1

## 2018-11-20 MED ORDER — CEFAZOLIN SODIUM-DEXTROSE 2-4 GM/100ML-% IV SOLN
2.0000 g | INTRAVENOUS | Status: AC
  Administered 2018-11-20: 2 g via INTRAVENOUS
  Filled 2018-11-20: qty 100

## 2018-11-20 MED ORDER — MENTHOL 3 MG MT LOZG: 1.0000 | LOZENGE | OROMUCOSAL | Status: DC | PRN

## 2018-11-20 MED ORDER — ONDANSETRON HCL 4 MG/2ML IJ SOLN: 4.0000 mg | Freq: Four times a day (QID) | INTRAMUSCULAR | Status: DC | PRN

## 2018-11-20 MED ORDER — ONDANSETRON HCL 4 MG/2ML IJ SOLN
INTRAMUSCULAR | Status: DC | PRN
  Administered 2018-11-20: 4 mg via INTRAVENOUS

## 2018-11-20 MED ORDER — INSULIN ASPART 100 UNIT/ML ~~LOC~~ SOLN
0.0000 [IU] | Freq: Three times a day (TID) | SUBCUTANEOUS | Status: DC
  Administered 2018-11-21 (×2): 3 [IU] via SUBCUTANEOUS

## 2018-11-20 MED ORDER — SENNA-DOCUSATE SODIUM 8.6-50 MG PO TABS: 2.0000 | ORAL_TABLET | Freq: Every day | ORAL | 1 refills | Status: AC

## 2018-11-20 MED ORDER — LIDOCAINE 2% (20 MG/ML) 5 ML SYRINGE
INTRAMUSCULAR | Status: AC
  Filled 2018-11-20: qty 5

## 2018-11-20 MED ORDER — DOCUSATE SODIUM 100 MG PO CAPS
100.0000 mg | ORAL_CAPSULE | Freq: Two times a day (BID) | ORAL | Status: DC
  Administered 2018-11-20 – 2018-11-21 (×2): 100 mg via ORAL
  Filled 2018-11-20 (×2): qty 1

## 2018-11-20 MED ORDER — PHENYLEPHRINE 40 MCG/ML (10ML) SYRINGE FOR IV PUSH (FOR BLOOD PRESSURE SUPPORT)
PREFILLED_SYRINGE | INTRAVENOUS | Status: AC
  Filled 2018-11-20: qty 10

## 2018-11-20 MED ORDER — METHOCARBAMOL 500 MG PO TABS
500.0000 mg | ORAL_TABLET | Freq: Four times a day (QID) | ORAL | Status: DC | PRN
  Administered 2018-11-20: 500 mg via ORAL
  Filled 2018-11-20: qty 1

## 2018-11-20 MED ORDER — LACTATED RINGERS IV SOLN
INTRAVENOUS | Status: DC
  Administered 2018-11-20 (×2): via INTRAVENOUS

## 2018-11-20 MED ORDER — MIDAZOLAM HCL 2 MG/2ML IJ SOLN
INTRAMUSCULAR | Status: DC | PRN
  Administered 2018-11-20: 2 mg via INTRAVENOUS

## 2018-11-20 MED ORDER — FENTANYL CITRATE (PF) 250 MCG/5ML IJ SOLN
INTRAMUSCULAR | Status: AC
  Filled 2018-11-20: qty 5

## 2018-11-20 MED ORDER — CHLORHEXIDINE GLUCONATE 4 % EX LIQD: 60.0000 mL | Freq: Once | CUTANEOUS | Status: DC

## 2018-11-20 MED ORDER — OXYCODONE HCL 5 MG PO TABS: 5.0000 mg | ORAL_TABLET | Freq: Once | ORAL | Status: DC | PRN

## 2018-11-20 MED ORDER — ASPIRIN EC 325 MG PO TBEC
325.0000 mg | DELAYED_RELEASE_TABLET | Freq: Two times a day (BID) | ORAL | Status: DC
  Administered 2018-11-20 – 2018-11-21 (×2): 325 mg via ORAL
  Filled 2018-11-20 (×2): qty 1

## 2018-11-20 MED ORDER — ATORVASTATIN CALCIUM 40 MG PO TABS
80.0000 mg | ORAL_TABLET | Freq: Every day | ORAL | Status: DC
  Administered 2018-11-21: 80 mg via ORAL
  Filled 2018-11-20: qty 2

## 2018-11-20 MED ORDER — BUPIVACAINE HCL (PF) 0.25 % IJ SOLN
INTRAMUSCULAR | Status: AC
  Filled 2018-11-20: qty 30

## 2018-11-20 MED ORDER — METOCLOPRAMIDE HCL 5 MG PO TABS: 5.0000 mg | ORAL_TABLET | Freq: Three times a day (TID) | ORAL | Status: DC | PRN

## 2018-11-20 MED ORDER — POLYETHYLENE GLYCOL 3350 17 G PO PACK: 17.0000 g | PACK | Freq: Every day | ORAL | Status: DC | PRN

## 2018-11-20 MED ORDER — MAGNESIUM CITRATE PO SOLN: 1.0000 | Freq: Once | ORAL | Status: DC | PRN

## 2018-11-20 MED ORDER — PHENOL 1.4 % MT LIQD
1.0000 | OROMUCOSAL | Status: DC | PRN
  Filled 2018-11-20: qty 177

## 2018-11-20 MED ORDER — ONDANSETRON HCL 4 MG PO TABS: 4.0000 mg | ORAL_TABLET | Freq: Four times a day (QID) | ORAL | Status: DC | PRN

## 2018-11-20 MED ORDER — ONDANSETRON HCL 4 MG PO TABS: 4.0000 mg | ORAL_TABLET | Freq: Three times a day (TID) | ORAL | 0 refills | Status: AC | PRN

## 2018-11-20 MED ORDER — STERILE WATER FOR IRRIGATION IR SOLN
Status: DC | PRN
  Administered 2018-11-20: 2000 mL

## 2018-11-20 MED ORDER — ONDANSETRON HCL 4 MG/2ML IJ SOLN
INTRAMUSCULAR | Status: AC
  Filled 2018-11-20: qty 2

## 2018-11-20 MED ORDER — PROPOFOL 500 MG/50ML IV EMUL
INTRAVENOUS | Status: DC | PRN
  Administered 2018-11-20: 75 ug/kg/min via INTRAVENOUS

## 2018-11-20 MED ORDER — ASPIRIN EC 325 MG PO TBEC: 325.0000 mg | DELAYED_RELEASE_TABLET | Freq: Two times a day (BID) | ORAL | 0 refills | Status: DC

## 2018-11-20 MED ORDER — PROPOFOL 10 MG/ML IV BOLUS
INTRAVENOUS | Status: AC
  Filled 2018-11-20: qty 60

## 2018-11-20 MED ORDER — HYDROCODONE-ACETAMINOPHEN 5-325 MG PO TABS
1.0000 | ORAL_TABLET | ORAL | Status: DC | PRN
  Administered 2018-11-20: 2 via ORAL
  Administered 2018-11-20 – 2018-11-21 (×3): 1 via ORAL
  Filled 2018-11-20: qty 1
  Filled 2018-11-20: qty 2
  Filled 2018-11-20 (×3): qty 1

## 2018-11-20 MED ORDER — ACETAMINOPHEN 325 MG PO TABS: 325.0000 mg | ORAL_TABLET | Freq: Four times a day (QID) | ORAL | Status: DC | PRN

## 2018-11-20 MED ORDER — MIDAZOLAM HCL 2 MG/2ML IJ SOLN
INTRAMUSCULAR | Status: AC
  Filled 2018-11-20: qty 2

## 2018-11-20 MED ORDER — KETOROLAC TROMETHAMINE 30 MG/ML IJ SOLN
INTRAMUSCULAR | Status: DC | PRN
  Administered 2018-11-20: 30 mg via INTRA_ARTICULAR

## 2018-11-20 MED ORDER — KETOROLAC TROMETHAMINE 30 MG/ML IJ SOLN
INTRAMUSCULAR | Status: AC
  Filled 2018-11-20: qty 1

## 2018-11-20 MED ORDER — FENTANYL CITRATE (PF) 100 MCG/2ML IJ SOLN
INTRAMUSCULAR | Status: DC | PRN
  Administered 2018-11-20: 50 ug via INTRAVENOUS

## 2018-11-20 MED ORDER — HYDROCODONE-ACETAMINOPHEN 10-325 MG PO TABS: 1.0000 | ORAL_TABLET | Freq: Four times a day (QID) | ORAL | 0 refills | Status: AC | PRN

## 2018-11-20 MED ORDER — SODIUM CHLORIDE 0.9 % IV SOLN
INTRAVENOUS | Status: DC | PRN
  Administered 2018-11-20: 50 ug/min via INTRAVENOUS

## 2018-11-20 MED ORDER — METHOCARBAMOL 500 MG IVPB - SIMPLE MED
500.0000 mg | Freq: Four times a day (QID) | INTRAVENOUS | Status: DC | PRN
  Filled 2018-11-20: qty 50

## 2018-11-20 MED ORDER — FENTANYL CITRATE (PF) 100 MCG/2ML IJ SOLN: 100.0000 ug | Freq: Once | INTRAMUSCULAR | Status: DC

## 2018-11-20 MED ORDER — HYDROCODONE-ACETAMINOPHEN 7.5-325 MG PO TABS: 1.0000 | ORAL_TABLET | ORAL | Status: DC | PRN

## 2018-11-20 MED ORDER — FENTANYL CITRATE (PF) 100 MCG/2ML IJ SOLN: 25.0000 ug | INTRAMUSCULAR | Status: DC | PRN

## 2018-11-20 MED ORDER — BISACODYL 10 MG RE SUPP: 10.0000 mg | Freq: Every day | RECTAL | Status: DC | PRN

## 2018-11-20 MED ORDER — BUPIVACAINE IN DEXTROSE 0.75-8.25 % IT SOLN
INTRATHECAL | Status: DC | PRN
  Administered 2018-11-20: 1.6 mL via INTRATHECAL

## 2018-11-20 MED ORDER — DEXAMETHASONE SODIUM PHOSPHATE 10 MG/ML IJ SOLN
INTRAMUSCULAR | Status: AC
  Filled 2018-11-20: qty 1

## 2018-11-20 MED ORDER — 0.9 % SODIUM CHLORIDE (POUR BTL) OPTIME
TOPICAL | Status: DC | PRN
  Administered 2018-11-20: 1000 mL

## 2018-11-20 MED ORDER — TRANEXAMIC ACID-NACL 1000-0.7 MG/100ML-% IV SOLN
1000.0000 mg | INTRAVENOUS | Status: AC
  Administered 2018-11-20: 1000 mg via INTRAVENOUS
  Filled 2018-11-20: qty 100

## 2018-11-20 MED ORDER — DIPHENHYDRAMINE HCL 12.5 MG/5ML PO ELIX: 12.5000 mg | ORAL_SOLUTION | ORAL | Status: DC | PRN

## 2018-11-20 MED ORDER — OXYCODONE HCL 5 MG/5ML PO SOLN: 5.0000 mg | Freq: Once | ORAL | Status: DC | PRN

## 2018-11-20 MED ORDER — MORPHINE SULFATE (PF) 2 MG/ML IV SOLN: 0.5000 mg | INTRAVENOUS | Status: DC | PRN

## 2018-11-20 MED ORDER — TRANEXAMIC ACID-NACL 1000-0.7 MG/100ML-% IV SOLN
1000.0000 mg | Freq: Once | INTRAVENOUS | Status: AC
  Administered 2018-11-20: 1000 mg via INTRAVENOUS
  Filled 2018-11-20: qty 100

## 2018-11-20 MED ORDER — LOSARTAN POTASSIUM 50 MG PO TABS
50.0000 mg | ORAL_TABLET | Freq: Every day | ORAL | Status: DC
  Administered 2018-11-21: 50 mg via ORAL
  Filled 2018-11-20 (×2): qty 1

## 2018-11-20 MED ORDER — ROCURONIUM BROMIDE 10 MG/ML (PF) SYRINGE
PREFILLED_SYRINGE | INTRAVENOUS | Status: AC
  Filled 2018-11-20: qty 10

## 2018-11-20 MED ORDER — EPHEDRINE SULFATE-NACL 50-0.9 MG/10ML-% IV SOSY
PREFILLED_SYRINGE | INTRAVENOUS | Status: DC | PRN
  Administered 2018-11-20 (×2): 10 mg via INTRAVENOUS

## 2018-11-20 MED ORDER — MIDAZOLAM HCL 2 MG/2ML IJ SOLN: 2.0000 mg | Freq: Once | INTRAMUSCULAR | Status: DC

## 2018-11-20 MED ORDER — DEXAMETHASONE SODIUM PHOSPHATE 10 MG/ML IJ SOLN
10.0000 mg | Freq: Once | INTRAMUSCULAR | Status: AC
  Administered 2018-11-21: 10 mg via INTRAVENOUS
  Filled 2018-11-20: qty 1

## 2018-11-20 MED ORDER — ACETAMINOPHEN 500 MG PO TABS
500.0000 mg | ORAL_TABLET | Freq: Four times a day (QID) | ORAL | Status: AC
  Administered 2018-11-20 – 2018-11-21 (×4): 500 mg via ORAL
  Filled 2018-11-20 (×4): qty 1

## 2018-11-20 MED ORDER — KETOROLAC TROMETHAMINE 15 MG/ML IJ SOLN
7.5000 mg | Freq: Four times a day (QID) | INTRAMUSCULAR | Status: AC
  Administered 2018-11-20 – 2018-11-21 (×4): 7.5 mg via INTRAVENOUS
  Filled 2018-11-20 (×4): qty 1

## 2018-11-20 MED ORDER — EPHEDRINE 5 MG/ML INJ
INTRAVENOUS | Status: AC
  Filled 2018-11-20: qty 10

## 2018-11-20 MED ORDER — PROPOFOL 10 MG/ML IV BOLUS
INTRAVENOUS | Status: DC | PRN
  Administered 2018-11-20: 30 mg via INTRAVENOUS

## 2018-11-20 MED ORDER — PROPOFOL 10 MG/ML IV BOLUS
INTRAVENOUS | Status: AC
  Filled 2018-11-20: qty 20

## 2018-11-20 MED ORDER — ZOLPIDEM TARTRATE 5 MG PO TABS: 5.0000 mg | ORAL_TABLET | Freq: Every evening | ORAL | Status: DC | PRN

## 2018-11-20 MED ORDER — ALUM & MAG HYDROXIDE-SIMETH 200-200-20 MG/5ML PO SUSP: 30.0000 mL | ORAL | Status: DC | PRN

## 2018-11-20 SURGICAL SUPPLY — 56 items
ACETAB CUP W GRIPTION 54MM (Plate) ×1 IMPLANT
ACETAB CUP W/GRIPTION 54 (Plate) ×2 IMPLANT
ARTICULEZE HEAD (Hips) ×3 IMPLANT
BIT DRILL 2.4X128 (BIT) ×2 IMPLANT
BIT DRILL 2.4X128MM (BIT) ×1
BLADE SAW SAG 73X25 THK (BLADE) ×1
BLADE SAW SGTL 73X25 THK (BLADE) ×2 IMPLANT
BLADE SURG SZ10 CARB STEEL (BLADE) ×6 IMPLANT
CLOSURE WOUND 1/2 X4 (GAUZE/BANDAGES/DRESSINGS) ×1
COVER SURGICAL LIGHT HANDLE (MISCELLANEOUS) ×3 IMPLANT
COVER WAND RF STERILE (DRAPES) ×2 IMPLANT
CUP ACETAB W/GRIPTION 54 (Plate) IMPLANT
DRAPE INCISE IOBAN 66X45 STRL (DRAPES) ×8 IMPLANT
DRAPE ORTHO SPLIT 77X108 STRL (DRAPES) ×4
DRAPE POUCH INSTRU U-SHP 10X18 (DRAPES) ×3 IMPLANT
DRAPE SHEET LG 3/4 BI-LAMINATE (DRAPES) ×3 IMPLANT
DRAPE SURG ORHT 6 SPLT 77X108 (DRAPES) ×2 IMPLANT
DRAPE U-SHAPE 47X51 STRL (DRAPES) ×3 IMPLANT
DRSG MEPILEX BORDER 4X8 (GAUZE/BANDAGES/DRESSINGS) ×3 IMPLANT
DURAPREP 26ML APPLICATOR (WOUND CARE) ×6 IMPLANT
ELECT BLADE TIP CTD 4 INCH (ELECTRODE) ×3 IMPLANT
ELECT REM PT RETURN 15FT ADLT (MISCELLANEOUS) ×3 IMPLANT
ELIMINATOR HOLE APEX DEPUY (Hips) ×2 IMPLANT
GLOVE BIO SURGEON STRL SZ7.5 (GLOVE) ×3 IMPLANT
GLOVE BIO SURGEON STRL SZ8 (GLOVE) ×3 IMPLANT
GLOVE BIOGEL PI IND STRL 8 (GLOVE) ×2 IMPLANT
GLOVE BIOGEL PI INDICATOR 8 (GLOVE) ×4
GOWN STRL REUS W/TWL 2XL LVL3 (GOWN DISPOSABLE) ×3 IMPLANT
GOWN STRL REUS W/TWL LRG LVL3 (GOWN DISPOSABLE) ×3 IMPLANT
HEAD ARTICULEZE (Hips) IMPLANT
HOOD PEEL AWAY FLYTE STAYCOOL (MISCELLANEOUS) ×9 IMPLANT
KIT BASIN OR (CUSTOM PROCEDURE TRAY) ×3 IMPLANT
LINER NEUTRAL 54X36MM PLUS 4 (Hips) ×2 IMPLANT
MANIFOLD NEPTUNE II (INSTRUMENTS) ×3 IMPLANT
NEEDLE HYPO 22GX1.5 SAFETY (NEEDLE) ×3 IMPLANT
NS IRRIG 1000ML POUR BTL (IV SOLUTION) ×3 IMPLANT
PACK TOTAL JOINT (CUSTOM PROCEDURE TRAY) ×3 IMPLANT
PROTECTOR NERVE ULNAR (MISCELLANEOUS) ×3 IMPLANT
RETRIEVER SUT HEWSON (MISCELLANEOUS) ×3 IMPLANT
STEM OFFSET DUOFIX SZ6 STD (Stem) ×2 IMPLANT
STRIP CLOSURE SKIN 1/2X4 (GAUZE/BANDAGES/DRESSINGS) ×3 IMPLANT
SUCTION FRAZIER HANDLE 12FR (TUBING) ×2
SUCTION TUBE FRAZIER 12FR DISP (TUBING) ×1 IMPLANT
SUT FIBERWIRE #2 38 T-5 BLUE (SUTURE) ×9
SUT MNCRL AB 4-0 PS2 18 (SUTURE) ×2 IMPLANT
SUT VIC AB 0 CT1 36 (SUTURE) ×3 IMPLANT
SUT VIC AB 2-0 CT1 27 (SUTURE) ×4
SUT VIC AB 2-0 CT1 TAPERPNT 27 (SUTURE) ×2 IMPLANT
SUT VIC AB 3-0 SH 8-18 (SUTURE) ×3 IMPLANT
SUTURE FIBERWR #2 38 T-5 BLUE (SUTURE) ×3 IMPLANT
SYR CONTROL 10ML LL (SYRINGE) ×3 IMPLANT
TOWEL OR 17X26 10 PK STRL BLUE (TOWEL DISPOSABLE) ×6 IMPLANT
TOWEL OR NON WOVEN STRL DISP B (DISPOSABLE) ×3 IMPLANT
TRAY FOLEY MTR SLVR 16FR STAT (SET/KITS/TRAYS/PACK) ×3 IMPLANT
WATER STERILE IRR 1000ML POUR (IV SOLUTION) ×6 IMPLANT
YANKAUER SUCT BULB TIP 10FT TU (MISCELLANEOUS) ×3 IMPLANT

## 2018-11-20 NOTE — Anesthesia Procedure Notes (Signed)
Spinal  Patient location during procedure: OR Start time: 11/20/2018 11:03 AM End time: 11/20/2018 11:08 AM Staffing Anesthesiologist: Kipp Brood, MD Resident/CRNA: Pearson Grippe, CRNA Performed: resident/CRNA  Preanesthetic Checklist Completed: patient identified, site marked, surgical consent, pre-op evaluation, timeout performed, IV checked, risks and benefits discussed and monitors and equipment checked Spinal Block Patient position: sitting Prep: DuraPrep Patient monitoring: heart rate, cardiac monitor, continuous pulse ox and blood pressure Approach: midline Location: L3-4 Injection technique: single-shot Needle Needle type: Pencan  Needle gauge: 24 G Needle length: 10 cm Assessment Sensory level: T6

## 2018-11-20 NOTE — Op Note (Signed)
11/20/2018  1:12 PM  PATIENT:  James Duran   MRN: [id number]  PRE-OPERATIVE DIAGNOSIS: Left hip pain  POST-OPERATIVE DIAGNOSIS:  same  PROCEDURE:  Procedure(s): TOTAL HIP ARTHROPLASTY  PREOPERATIVE INDICATIONS:    James Duran is an 68 y.o. male who has a diagnosis of left hip osteoarthritis and elected for surgical management after failing conservative treatment.  The risks benefits and alternatives were discussed with the patient including but not limited to the risks of nonoperative treatment, versus surgical intervention including infection, bleeding, nerve injury, periprosthetic fracture, the need for revision surgery, dislocation, leg length discrepancy, blood clots, cardiopulmonary complications, morbidity, mortality, among others, and they were willing to proceed.     OPERATIVE REPORT     SURGEON:  Marchia Bond, MD    ASSISTANT:  Joya Gaskins, OPA-C  (Present throughout the entire procedure,  necessary for completion of procedure in a timely manner, assisting with retraction, instrumentation, and closure)     ANESTHESIA: Spinal  ESTIMATED BLOOD LOSS: 419 mL    COMPLICATIONS:  None.     UNIQUE ASPECTS OF THE CASE: He still had a little bit of shuck after the implants were in, and the leg lengths felt restored, if anything the left side felt a little bit long, but the stability was excellent, soft tissue was repairable and easily went back to bone, and the shuck was appropriate.  His proximal femur was a little bit retroverted, or at least neutral, I did slightly antevert the prosthesis giving him about 20 degrees of anteversion.  COMPONENTS:  [employer] fit femur size 6 with a 37+9 metallic head ball and a Gription Acetabular shell size 54, with an apex hole eliminator and a +4 neutral polyethylene liner.    PROCEDURE IN DETAIL:   The patient was met in the holding area and  identified.  The appropriate hip was identified and marked at the operative  site.  The patient was then transported to the OR  and  placed under anesthesia.  At that point, the patient was  placed in the lateral decubitus position with the operative side up and  secured to the operating room table and all bony prominences padded.     The operative lower extremity was prepped from the iliac crest to the distal leg.  Sterile draping was performed.  Time out was performed prior to incision.      A routine posterolateral approach was utilized via sharp dissection  carried down to the subcutaneous tissue.  Gross bleeders were Bovie coagulated.  The iliotibial band was identified and incised along the length of the skin incision.  Self-retaining retractors were  inserted.  With the hip internally rotated, the short external rotators  were identified. The piriformis and capsule was tagged with FiberWire, and the hip capsule released in a T-type fashion.  The femoral neck was exposed, and I resected the femoral neck using the appropriate jig. This was performed at approximately a thumb's breadth above the lesser trochanter.    I then exposed the deep acetabulum, cleared out any tissue including the ligamentum teres.  A wing retractor was placed.  After adequate visualization, I excised the labrum, and then sequentially reamed.  I placed the trial acetabulum, which seated nicely, and then impacted the real cup into place.  Appropriate version and inclination was confirmed clinically matching their bony anatomy, and also with the use of the jig.  I placed a cancellous screw to augment fixation.  A trial polyethylene liner was  placed and the wing retractor removed.    I then prepared the proximal femur using the cookie-cutter, the lateralizing reamer, and then sequentially reamed and broached.  A trial broach, neck, and head was utilized, and I reduced the hip and it was found to have excellent stability with functional range of motion. The trial components were then removed, and the  real polyethylene liner was placed.  I then impacted the real femoral prosthesis into place into the appropriate version, slightly anteverted to the normal anatomy, and I impacted the real head ball into place. The hip was then reduced and taken through functional range of motion and found to have excellent stability. Leg lengths were restored.  I then used a 2 mm drill bits to pass the FiberWire suture from the capsule and piriformis through the greater trochanter, and secured this. Excellent posterior capsular repair was achieved. I also closed the T in the capsule.  I then irrigated the hip copiously again with pulse lavage, and repaired the fascia with Vicryl, followed by Vicryl for the subcutaneous tissue, Monocryl for the skin, Steri-Strips and sterile gauze. The wounds were injected. The patient was then awakened and returned to PACU in stable and satisfactory condition. There were no complications.  Marchia Bond, MD Orthopedic Surgeon [phone]   11/20/2018 1:12 PM

## 2018-11-20 NOTE — Care Plan (Signed)
Ortho Bundle Case Management Note  Patient Details  Name: James Duran MRN: [id number] Date of Birth: 04-Mar-1951    Spoke with patient prior to surgery. He will discharge to home with family and HHPT. He has all needed equipment at home. OPPT set up if needed.                DME Arranged:    DME Agency:     HH Arranged:  PT HH Agency:  [facility]  Additional Comments: Please contact me with any questions of if this plan should need to change.  Shauna Hugh,  RN,BSN,MHA,CCM  [facility] Orthopaedic Specialist  [phone] 11/20/2018, 12:06 PM

## 2018-11-20 NOTE — H&P (Signed)
PREOPERATIVE H&P  Chief Complaint: left hip pain  HPI: James Duran is a 68 y.o. male who presents for preoperative history and physical with a diagnosis of left hip osteoarthritis. Symptoms are rated as moderate to severe, and have been worsening.  This is significantly impairing activities of daily living.  He has elected for surgical management.   He has failed injections, activity modification, anti-inflammatories, and assistive devices.  Preoperative X-rays demonstrate end stage degenerative changes with osteophyte formation, loss of joint space, subchondral sclerosis.  He has had the other side done with excellent result.    Past Medical History:  Diagnosis Date  . Arthritis   . Diabetes mellitus without complication (HCC)   . Hypertension   . Osteoarthritis of right hip 07/08/2014   Past Surgical History:  Procedure Laterality Date  . CERVICAL FUSION    . FOOT SURGERY Right   . TOTAL HIP ARTHROPLASTY Right 07/08/2014   Procedure: TOTAL HIP ARTHROPLASTY;  Surgeon: Eulas Post, MD;  Location: [facility] OR;  Service: Orthopedics;  Laterality: Right;   Social History   Socioeconomic History  . Marital status: Married    Spouse name: Not on file  . Number of children: Not on file  . Years of education: Not on file  . Highest education level: Not on file  Occupational History  . Not on file  Social Needs  . Financial resource strain: Not on file  . Food insecurity:    Worry: Not on file    Inability: Not on file  . Transportation needs:    Medical: Not on file    Non-medical: Not on file  Tobacco Use  . Smoking status: Current Some Day Smoker    Packs/day: 0.00    Years: 40.00    Pack years: 0.00    Types: Cigars  . Smokeless tobacco: Never Used  Substance and Sexual Activity  . Alcohol use: Yes    Alcohol/week: 1.0 standard drinks    Types: 1 Glasses of wine per week    Comment: 2 glasses daily  . Drug use: No  . Sexual activity: Not on file  Lifestyle  .  Physical activity:    Days per week: Not on file    Minutes per session: Not on file  . Stress: Not on file  Relationships  . Social connections:    Talks on phone: Not on file    Gets together: Not on file    Attends religious service: Not on file    Active member of club or organization: Not on file    Attends meetings of clubs or organizations: Not on file    Relationship status: Not on file  Other Topics Concern  . Not on file  Social History Narrative  . Not on file   History reviewed. No pertinent family history. No Known Allergies Prior to Admission medications   Medication Sig Start Date End Date Taking? Authorizing Provider  atorvastatin (LIPITOR) 80 MG tablet Take 80 mg by mouth daily.   Yes [provider]  losartan (COZAAR) 50 MG tablet Take 50 mg by mouth daily.   Yes [provider]  metFORMIN (GLUCOPHAGE) 500 MG tablet Take 500 mg by mouth daily with breakfast.   Yes [provider]     Positive ROS: All other systems have been reviewed and were otherwise negative with the exception of those mentioned in the HPI and as above.  Physical Exam: General: Alert, no acute distress Cardiovascular: No pedal edema Respiratory:  No cyanosis, no use of accessory musculature GI: No organomegaly, abdomen is soft and non-tender Skin: No lesions in the area of chief complaint Neurologic: Sensation intact distally Psychiatric: Patient is competent for consent with normal mood and affect Lymphatic: No axillary or cervical lymphadenopathy  MUSCULOSKELETAL: left hip painful arc of motion, 0-80 degrees with no IR.  EHL intact.  Assessment: Left hip osteoarthritis, primary localized.   Plan: Plan for Procedure(s): TOTAL HIP ARTHROPLASTY  The risks benefits and alternatives were discussed with the patient including but not limited to the risks of nonoperative treatment, versus surgical intervention including infection, bleeding, nerve injury,  periprosthetic fracture, the need for revision surgery, dislocation, leg length discrepancy, blood clots, cardiopulmonary complications, morbidity, mortality, among others, and they were willing to proceed.     Anticipated LOS equal to or greater than 2 midnights due to - Age 66 and older with one or more of the following:  - Obesity  - Expected need for hospital services (PT, OT, Nursing) required for safe  discharge  - Anticipated need for postoperative skilled nursing care or inpatient rehab  - Active co-morbidities: None OR   - Unanticipated findings during/Post Surgery: None  - Patient is a high risk of re-admission due to: None     Eulas PostJoshua P Windsor Zirkelbach, MD Cell [phone]   11/20/2018 9:41 AM

## 2018-11-20 NOTE — Anesthesia Postprocedure Evaluation (Signed)
Anesthesia Post Note  Patient: James Duran  Procedure(s) Performed: TOTAL HIP ARTHROPLASTY (Left Hip)     Patient location during evaluation: PACU Anesthesia Type: General Level of consciousness: oriented and awake and alert Pain management: pain level controlled Vital Signs Assessment: post-procedure vital signs reviewed and stable Respiratory status: spontaneous breathing, respiratory function stable and patient connected to nasal cannula oxygen Cardiovascular status: blood pressure returned to baseline and stable Postop Assessment: no headache, no backache and no apparent nausea or vomiting Anesthetic complications: no    Last Vitals:  Vitals:   11/20/18 1758 11/20/18 1838  BP: (!) 156/85 (!) 144/85  Pulse: 76 75  Resp: 16 20  Temp: 37.1 C 36.9 C  SpO2: 100% 100%    Last Pain:  Vitals:   11/20/18 1819  TempSrc:   PainSc: 6                  Yorley Buch COKER

## 2018-11-20 NOTE — Anesthesia Preprocedure Evaluation (Addendum)
Anesthesia Evaluation  Patient identified by MRN, date of birth, ID band Patient awake    Reviewed: Allergy & Precautions, NPO status , Patient's Chart, lab work & pertinent test results  Airway Mallampati: II  TM Distance: >3 FB Neck ROM: Full    Dental  (+) Teeth Intact, Dental Advisory Given   Pulmonary Current Smoker,    breath sounds clear to auscultation       Cardiovascular hypertension,  Rhythm:Regular Rate:Normal     Neuro/Psych    GI/Hepatic   Endo/Other  diabetes  Renal/GU      Musculoskeletal   Abdominal   Peds  Hematology   Anesthesia Other Findings   Reproductive/Obstetrics                           Anesthesia Physical Anesthesia Plan  ASA: III  Anesthesia Plan: General   Post-op Pain Management:    Induction: Intravenous  PONV Risk Score and Plan: Ondansetron  Airway Management Planned: Oral ETT  Additional Equipment:   Intra-op Plan:   Post-operative Plan: Extubation in OR  Informed Consent: I have reviewed the patients History and Physical, chart, labs and discussed the procedure including the risks, benefits and alternatives for the proposed anesthesia with the patient or authorized representative who has indicated his/her understanding and acceptance.   Dental advisory given  Plan Discussed with: CRNA and Anesthesiologist  Anesthesia Plan Comments:        Anesthesia Quick Evaluation

## 2018-11-20 NOTE — Discharge Instructions (Signed)

## 2018-11-20 NOTE — Transfer of Care (Signed)
Immediate Anesthesia Transfer of Care Note  Patient: Madelyn Leet  Procedure(s) Performed: TOTAL HIP ARTHROPLASTY (Left Hip)  Patient Location: PACU  Anesthesia Type:Spinal  Level of Consciousness: sedated  Airway & Oxygen Therapy: Patient Spontanous Breathing and Patient connected to face mask oxygen  Post-op Assessment: Report given to RN and Post -op Vital signs reviewed and stable  Post vital signs: Reviewed and stable  Last Vitals:  Vitals Value Taken Time  BP    Temp    Pulse 74 11/20/2018  1:24 PM  Resp 15 11/20/2018  1:24 PM  SpO2 100 % 11/20/2018  1:24 PM  Vitals shown include unvalidated device data.  Last Pain:  Vitals:   11/20/18 0849  TempSrc:   PainSc: 0-No pain         Complications: No apparent anesthesia complications

## 2018-11-21 ENCOUNTER — Encounter (HOSPITAL_COMMUNITY): Payer: Self-pay | Admitting: Orthopedic Surgery

## 2018-11-21 DIAGNOSIS — M1612 Unilateral primary osteoarthritis, left hip: Secondary | ICD-10-CM | POA: Diagnosis not present

## 2018-11-21 LAB — BASIC METABOLIC PANEL
Anion gap: 6 (ref 5–15)
BUN: 18 mg/dL (ref 8–23)
CO2: 24 mmol/L (ref 22–32)
Calcium: 8.3 mg/dL — ABNORMAL LOW (ref 8.9–10.3)
Chloride: 104 mmol/L (ref 98–111)
Creatinine, Ser: 1 mg/dL (ref 0.61–1.24)
GFR calc Af Amer: >60 mL/min (ref >60)
Glucose, Bld: 147 mg/dL — ABNORMAL HIGH (ref 70–99)
Potassium: 4.1 mmol/L (ref 3.5–5.1)
Sodium: 134 mmol/L — ABNORMAL LOW (ref 135–145)

## 2018-11-21 LAB — CBC
HCT: 40.8 % (ref 39.0–52.0)
Hemoglobin: 13.3 g/dL (ref 13.0–17.0)
MCH: 31.4 pg (ref 26.0–34.0)
MCHC: 32.6 g/dL (ref 30.0–36.0)
MCV: 96.2 fL (ref 80.0–100.0)
Platelets: 263 10*3/uL (ref 150–400)
RBC: 4.24 MIL/uL (ref 4.22–5.81)
RDW: 12.7 % (ref 11.5–15.5)
WBC: 14.5 10*3/uL — ABNORMAL HIGH (ref 4.0–10.5)
nRBC: 0 % (ref 0.0–0.2)

## 2018-11-21 LAB — GLUCOSE, CAPILLARY
Glucose-Capillary: 160 mg/dL — ABNORMAL HIGH (ref 70–99)
Glucose-Capillary: 174 mg/dL — ABNORMAL HIGH (ref 70–99)

## 2018-11-21 NOTE — Plan of Care (Signed)
Pt alert and oriented, resting with pain well controlled with PO pain meds. Plan to d/d home per MD order. Passed therapy goals, ready to d/c.RN will monitor.

## 2018-11-21 NOTE — Progress Notes (Addendum)
Patient ID: James Duran, male   DOB: 10/19/1951, 68 y.o.   MRN: [id number]     Subjective:  Patient reports pain as mild. Patient in bed and denies any CP or SOB  Objective:   VITALS:   Vitals:   11/20/18 1758 11/20/18 1838 11/21/18 0255 11/21/18 0537  BP: (!) 156/85 (!) 144/85 109/71 119/68  Pulse: 76 75 (!) 58 63  Resp: 16 20 14 16   Temp: 98.8 F (37.1 C) 98.5 F (36.9 C) 98.4 F (36.9 C) 98.5 F (36.9 C)  TempSrc:   Oral Oral  SpO2: 100% 100% 99% 100%  Weight:      Height:        ABD soft Sensation intact distally Dorsiflexion/Plantar flexion intact Incision: dressing C/D/I and scant drainage   Lab Results  Component Value Date   WBC 14.5 (H) 11/21/2018   HGB 13.3 11/21/2018   HCT 40.8 11/21/2018   MCV 96.2 11/21/2018   PLT 263 11/21/2018   BMET    Component Value Date/Time   NA 134 (L) 11/21/2018 0456   K 4.1 11/21/2018 0456   CL 104 11/21/2018 0456   CO2 24 11/21/2018 0456   GLUCOSE 147 (H) 11/21/2018 0456   BUN 18 11/21/2018 0456   CREATININE 1.00 11/21/2018 0456   CALCIUM 8.3 (L) 11/21/2018 0456   GFRNONAA >60 11/21/2018 0456   GFRAA >60 11/21/2018 0456     Assessment/Plan: 1 Day Post-Op   Principal Problem:   Primary localized osteoarthritis of left hip Active Problems:   Status post left hip replacement   Advance diet Up with therapy Discharge home with home health WBAT Dry dressing PRN    Nelda Severe 11/21/2018, 7:39 AM  Discussed and agree with above.  Teryl Lucy, MD Cell [phone]

## 2018-11-21 NOTE — Evaluation (Signed)
Physical Therapy Evaluation Patient Details Name: James Duran MRN: [id number] DOB: [birth year] Today's Date: 11/21/2018   History of Present Illness  Pt s/p L THR and with hx of R THR, DM, and cervical fusion  Clinical Impression  Pt s/p L THR and presents with decreased L LE strength/ROM, post op pain and posterior THP limiting functional mobility.  Pt should progress to dc home with family assist.    Follow Up Recommendations Home health PT    Equipment Recommendations  None recommended by PT    Recommendations for Other Services       Precautions / Restrictions Precautions Precautions: Posterior Hip Precaution Booklet Issued: Yes (comment) Precaution Comments: Reviewed x 2 Restrictions Weight Bearing Restrictions: No Other Position/Activity Restrictions: WBAT      Mobility  Bed Mobility Overal bed mobility: Needs Assistance Bed Mobility: Supine to Sit     Supine to sit: Min guard     General bed mobility comments: cues for sequence, use of R LE to self assist and adherence to THP  Transfers Overall transfer level: Needs assistance Equipment used: Rolling walker (2 wheeled) Transfers: Sit to/from Stand Sit to Stand: Min assist;Min guard         General transfer comment: cues for LE management and use of UEs to self assist  Ambulation/Gait Ambulation/Gait assistance: Min assist;Min guard Gait Distance (Feet): 111 Feet Assistive device: Rolling walker (2 wheeled) Gait Pattern/deviations: Step-to pattern;Decreased step length - right;Decreased step length - left;Shuffle;Trunk flexed     General Gait Details: cues for posture, position from RW and initial sequence  Stairs            Wheelchair Mobility    Modified Rankin (Stroke Patients Only)       Balance Overall balance assessment: Mild deficits observed, not formally tested                                           Pertinent Vitals/Pain Pain Assessment: 0-10 Pain  Score: 5  Pain Location: L hip Pain Descriptors / Indicators: Aching;Sore Pain Intervention(s): Limited activity within patient's tolerance;Monitored during session;Premedicated before session;Ice applied    Home Living Family/patient expects to be discharged to:: Private residence Living Arrangements: Spouse/significant other Available Help at Discharge: Family Type of Home: House Home Access: Stairs to enter Entrance Stairs-Rails: None Entrance Stairs-Number of Steps: 3 Home Layout: One level Home Equipment: Environmental consultant - 2 wheels;Cane - single point;Bedside commode      Prior Function Level of Independence: Independent               Hand Dominance   Dominant Hand: Right    Extremity/Trunk Assessment   Upper Extremity Assessment Upper Extremity Assessment: Overall WFL for tasks assessed    Lower Extremity Assessment Lower Extremity Assessment: LLE deficits/detail LLE Deficits / Details: 2+/5 strength at hip with AAROm at hip to 80 flex and 20 abd    Cervical / Trunk Assessment Cervical / Trunk Assessment: Normal  Communication   Communication: No difficulties  Cognition Arousal/Alertness: Awake/alert Behavior During Therapy: WFL for tasks assessed/performed Overall Cognitive Status: Within Functional Limits for tasks assessed                                        General Comments      Exercises  Total Joint Exercises Ankle Circles/Pumps: AROM;Both;20 reps;Supine Quad Sets: AROM;10 reps;Supine;Both Heel Slides: Left;20 reps;Supine;AAROM Hip ABduction/ADduction: AAROM;Left;15 reps;Supine   Assessment/Plan    PT Assessment Patient needs continued PT services  PT Problem List Decreased strength;Decreased range of motion;Decreased activity tolerance;Decreased mobility;Pain;Decreased knowledge of use of DME;Decreased knowledge of precautions       PT Treatment Interventions DME instruction;Gait training;Stair training;Functional mobility  training;Therapeutic activities;Therapeutic exercise;Patient/family education    PT Goals (Current goals can be found in the Care Plan section)  Acute Rehab PT Goals Patient Stated Goal: Regain IND PT Goal Formulation: With patient Time For Goal Achievement: 11/28/18 Potential to Achieve Goals: Good    Frequency 7X/week   Barriers to discharge        Co-evaluation               AM-PAC PT "6 Clicks" Mobility  Outcome Measure Help needed turning from your back to your side while in a flat bed without using bedrails?: A Little Help needed moving from lying on your back to sitting on the side of a flat bed without using bedrails?: A Little Help needed moving to and from a bed to a chair (including a wheelchair)?: A Little Help needed standing up from a chair using your arms (e.g., wheelchair or bedside chair)?: A Little Help needed to walk in hospital room?: A Little Help needed climbing 3-5 steps with a railing? : A Little 6 Click Score: 18    End of Session Equipment Utilized During Treatment: Gait belt Activity Tolerance: Patient tolerated treatment well Patient left: in chair;with call bell/phone within reach Nurse Communication: Mobility status PT Visit Diagnosis: Difficulty in walking, not elsewhere classified (R26.2)    Time: 9794-8016 PT Time Calculation (min) (ACUTE ONLY): 30 min   Charges:   PT Evaluation $PT Eval Low Complexity: 1 Low PT Treatments $Therapeutic Exercise: 8-22 mins        Mauro Kaufmann PT Acute Rehabilitation Services Pager [phone] Office [phone]   Toben Acuna 11/21/2018, 12:25 PM

## 2018-11-21 NOTE — Care Management CC44 (Signed)
Condition Code 44 Documentation Completed  Patient Details  Name: James Duran MRN: [id number] Date of Birth: 1951/10/20   Condition Code 44 given:  Yes Patient signature on Condition Code 44 notice:  Yes Documentation of 2 MD's agreement:  Yes Code 44 added to claim:  Yes    Alexis Goodell, RN 11/21/2018, 9:49 AM

## 2018-11-21 NOTE — Care Management Obs Status (Signed)
MEDICARE OBSERVATION STATUS NOTIFICATION   Patient Details  Name: James GirtKarl-Heinz Duran MRN: [id number] Date of Birth: 02/06/1951   Medicare Observation Status Notification Given:  Yes    Alexis Goodelleele, Jennavieve Arrick K, RN 11/21/2018, 9:48 AM

## 2018-11-21 NOTE — Progress Notes (Signed)
Physical Therapy Treatment Patient Details Name: James Duran MRN: [id number] DOB: April 27, 1951 Today's Date: 11/21/2018    History of Present Illness Pt s/p L THR and with hx of R THR, DM, and cervical fusion    PT Comments    Pt motivated and progressing well with mobility.  Pt reviewed stairs and car transfers.  Handouts provided for stairs and home therex.   Follow Up Recommendations  Home health PT     Equipment Recommendations  None recommended by PT    Recommendations for Other Services       Precautions / Restrictions Precautions Precautions: Posterior Hip Precaution Booklet Issued: Yes (comment) Precaution Comments: Pt recalls 2/3 THP without cues - all reviewed Restrictions Weight Bearing Restrictions: No Other Position/Activity Restrictions: WBAT    Mobility  Bed Mobility Overal bed mobility: Needs Assistance Bed Mobility: Supine to Sit     Supine to sit: Min guard     General bed mobility comments: Pt up in chair and requests back to same  Transfers Overall transfer level: Needs assistance Equipment used: Rolling walker (2 wheeled) Transfers: Sit to/from Stand Sit to Stand: Min guard;Supervision         General transfer comment: cues for LE management and use of UEs to self assist  Ambulation/Gait Ambulation/Gait assistance: Min guard;Supervision Gait Distance (Feet): 125 Feet Assistive device: Rolling walker (2 wheeled) Gait Pattern/deviations: Decreased step length - right;Decreased step length - left;Shuffle;Trunk flexed;Step-to pattern;Step-through pattern Gait velocity: decr   General Gait Details: cues for posture, position from RW and initial sequence   Stairs Stairs: Yes Stairs assistance: Min guard Stair Management: One rail Right;Step to pattern;Forwards;With crutches Number of Stairs: 5 General stair comments: min cues for sequence and foot/crutch placement   Wheelchair Mobility    Modified Rankin (Stroke Patients  Only)       Balance Overall balance assessment: Mild deficits observed, not formally tested                                          Cognition Arousal/Alertness: Awake/alert Behavior During Therapy: WFL for tasks assessed/performed Overall Cognitive Status: Within Functional Limits for tasks assessed                                        Exercises Total Joint Exercises Ankle Circles/Pumps: AROM;Both;20 reps;Supine Quad Sets: AROM;10 reps;Supine;Both Heel Slides: Left;20 reps;Supine;AAROM Hip ABduction/ADduction: AAROM;Left;15 reps;Supine    General Comments        Pertinent Vitals/Pain Pain Assessment: 0-10 Pain Score: 4  Pain Location: L hip Pain Descriptors / Indicators: Aching;Sore Pain Intervention(s): Limited activity within patient's tolerance;Monitored during session;Premedicated before session;Ice applied    Home Living Family/patient expects to be discharged to:: Private residence Living Arrangements: Spouse/significant other Available Help at Discharge: Family Type of Home: House Home Access: Stairs to enter Entrance Stairs-Rails: None Home Layout: One level Home Equipment: Environmental consultant - 2 wheels;Cane - single point;Bedside commode      Prior Function Level of Independence: Independent          PT Goals (current goals can now be found in the care plan section) Acute Rehab PT Goals Patient Stated Goal: Regain IND PT Goal Formulation: With patient Time For Goal Achievement: 11/28/18 Potential to Achieve Goals: Good Progress towards PT goals: Progressing toward goals  Frequency    7X/week      PT Plan Current plan remains appropriate    Co-evaluation              AM-PAC PT "6 Clicks" Mobility   Outcome Measure  Help needed turning from your back to your side while in a flat bed without using bedrails?: A Little Help needed moving from lying on your back to sitting on the side of a flat bed without  using bedrails?: A Little Help needed moving to and from a bed to a chair (including a wheelchair)?: A Little Help needed standing up from a chair using your arms (e.g., wheelchair or bedside chair)?: A Little Help needed to walk in hospital room?: A Little Help needed climbing 3-5 steps with a railing? : A Little 6 Click Score: 18    End of Session Equipment Utilized During Treatment: Gait belt Activity Tolerance: Patient tolerated treatment well Patient left: in chair;with call bell/phone within reach Nurse Communication: Mobility status PT Visit Diagnosis: Difficulty in walking, not elsewhere classified (R26.2)     Time: [id number] PT Time Calculation (min) (ACUTE ONLY): 22 min  Charges:  $Gait Training: 8-22 mins $Therapeutic Exercise: 8-22 mins                     Mauro KaufmannHunter Rozella Servello PT Acute Rehabilitation Services Pager [phone] Office [phone]    Tausha Milhoan 11/21/2018, 12:33 PM

## 2018-11-21 NOTE — Discharge Summary (Signed)
Physician Discharge Summary  Patient ID: Saharsh Cobarrubias MRN: [id number] DOB/AGE: July 25, 1951 68 y.o.  Admit date: 11/20/2018 Discharge date: 11/21/2018  Admission Diagnoses:  Primary localized osteoarthritis of left hip  Discharge Diagnoses:  Principal Problem:   Primary localized osteoarthritis of left hip Active Problems:   Status post left hip replacement   Past Medical History:  Diagnosis Date  . Arthritis   . Diabetes mellitus without complication (HCC)   . Hypertension   . Osteoarthritis of right hip 07/08/2014  . Primary localized osteoarthritis of left hip 11/20/2018    Surgeries: Procedure(s): TOTAL HIP ARTHROPLASTY on 11/20/2018   Consultants (if any):   Discharged Condition: Improved  Hospital Course: Erwin Rasheed is an 68 y.o. male who was admitted 11/20/2018 with a diagnosis of Primary localized osteoarthritis of left hip and went to the operating room on 11/20/2018 and underwent the above named procedures.    He was given perioperative antibiotics:  Anti-infectives (From admission, onward)   Start     Dose/Rate Route Frequency Ordered Stop   11/20/18 1700  ceFAZolin (ANCEF) IVPB 2g/100 mL premix     2 g 200 mL/hr over 30 Minutes Intravenous Every 6 hours 11/20/18 1537 11/20/18 2301   11/20/18 0830  ceFAZolin (ANCEF) IVPB 2g/100 mL premix     2 g 200 mL/hr over 30 Minutes Intravenous On call to O.R. 11/20/18 8676 11/20/18 1143    .  He was given sequential compression devices, early ambulation, and aspirin for DVT prophylaxis.  He benefited maximally from the hospital stay and there were no complications.    Recent vital signs:  Vitals:   11/21/18 0255 11/21/18 0537  BP: 109/71 119/68  Pulse: (!) 58 63  Resp: 14 16  Temp: 98.4 F (36.9 C) 98.5 F (36.9 C)  SpO2: 99% 100%    Recent laboratory studies:  Lab Results  Component Value Date   HGB 13.3 11/21/2018   HGB 15.3 11/13/2018   HGB 12.2 (L) 07/09/2014   Lab Results  Component Value Date   WBC 14.5 (H) 11/21/2018   PLT 263 11/21/2018   Lab Results  Component Value Date   INR 0.98 06/27/2014   Lab Results  Component Value Date   NA 134 (L) 11/21/2018   K 4.1 11/21/2018   CL 104 11/21/2018   CO2 24 11/21/2018   BUN 18 11/21/2018   CREATININE 1.00 11/21/2018   GLUCOSE 147 (H) 11/21/2018    Discharge Medications:   Allergies as of 11/21/2018   No Known Allergies     Medication List    TAKE these medications   aspirin EC 325 MG tablet Take 1 tablet (325 mg total) by mouth 2 (two) times daily.   atorvastatin 80 MG tablet Commonly known as:  LIPITOR Take 80 mg by mouth daily.   baclofen 10 MG tablet Commonly known as:  LIORESAL Take 1 tablet (10 mg total) by mouth 3 (three) times daily. As needed for muscle spasm   HYDROcodone-acetaminophen 10-325 MG tablet Commonly known as:  NORCO Take 1 tablet by mouth every 6 (six) hours as needed.   losartan 50 MG tablet Commonly known as:  COZAAR Take 50 mg by mouth daily.   metFORMIN 500 MG tablet Commonly known as:  GLUCOPHAGE Take 500 mg by mouth daily with breakfast.   ondansetron 4 MG tablet Commonly known as:  ZOFRAN Take 1 tablet (4 mg total) by mouth every 8 (eight) hours as needed for nausea or vomiting.   sennosides-docusate sodium 8.6-50  MG tablet Commonly known as:  SENOKOT-S Take 2 tablets by mouth daily.       Diagnostic Studies: Dg Pelvis Portable  Result Date: 11/20/2018 CLINICAL DATA:  Post op lt total hip replacement, EXAM: PORTABLE PELVIS 1-2 VIEWS COMPARISON:  07/08/2014 FINDINGS: Interval left hip arthroplasty, components project in expected location. No fracture or dislocation. Stable right hip arthroplasty components. IMPRESSION: Interval left hip arthroplasty without apparent complication. Electronically Signed   By: Corlis Leak M.D.   On: 11/20/2018 13:47    Disposition: Discharge disposition: 01-Home or Self Care         Follow-up Information    Teryl Lucy, MD. Go on  12/03/2018.   Specialty:  Orthopedic Surgery Why:  Your appointment has been made for 1230 Contact information: [address] [place] [address] [phone]        [facility]. Go on 12/10/2018.   Why:  You are scheduled to start outpatient physical therapy at 1:00. Please arrive at 12:30 to complete your paperwork.  Contact information: [address]  [place], [place] [address]   [phone]       [facility] Follow up.   Specialty:  Home Health Services Why:  HH[facility] will visit you 5 times prior to your follow up with Dr. Erskine Emery information: [address] [place] [address] [phone]            Signed: Eulas Post 11/21/2018, 7:50 AM

## 2020-03-18 ENCOUNTER — Emergency Department (HOSPITAL_BASED_OUTPATIENT_CLINIC_OR_DEPARTMENT_OTHER)
Admission: EM | Admit: 2020-03-18 | Discharge: 2020-03-18 | Disposition: A | Discharge status: Home / Self Care | Payer: Medicare Other | Attending: Emergency Medicine | Admitting: Emergency Medicine

## 2020-03-18 ENCOUNTER — Encounter (HOSPITAL_BASED_OUTPATIENT_CLINIC_OR_DEPARTMENT_OTHER): Payer: Self-pay

## 2020-03-18 ENCOUNTER — Emergency Department (HOSPITAL_BASED_OUTPATIENT_CLINIC_OR_DEPARTMENT_OTHER): Payer: Medicare Other

## 2020-03-18 ENCOUNTER — Other Ambulatory Visit: Payer: Self-pay

## 2020-03-18 DIAGNOSIS — E119 Type 2 diabetes mellitus without complications: Secondary | ICD-10-CM | POA: Diagnosis not present

## 2020-03-18 DIAGNOSIS — Z79899 Other long term (current) drug therapy: Secondary | ICD-10-CM | POA: Insufficient documentation

## 2020-03-18 DIAGNOSIS — Z7984 Long term (current) use of oral hypoglycemic drugs: Secondary | ICD-10-CM | POA: Insufficient documentation

## 2020-03-18 DIAGNOSIS — Z87891 Personal history of nicotine dependence: Secondary | ICD-10-CM | POA: Insufficient documentation

## 2020-03-18 DIAGNOSIS — I10 Essential (primary) hypertension: Secondary | ICD-10-CM | POA: Insufficient documentation

## 2020-03-18 DIAGNOSIS — L03116 Cellulitis of left lower limb: Secondary | ICD-10-CM | POA: Insufficient documentation

## 2020-03-18 DIAGNOSIS — R2242 Localized swelling, mass and lump, left lower limb: Secondary | ICD-10-CM | POA: Diagnosis present

## 2020-03-18 LAB — BASIC METABOLIC PANEL
Anion gap: 10 (ref 5–15)
BUN: 18 mg/dL (ref 8–23)
CO2: 22 mmol/L (ref 22–32)
Calcium: 8.7 mg/dL — ABNORMAL LOW (ref 8.9–10.3)
Chloride: 106 mmol/L (ref 98–111)
Creatinine, Ser: 1.08 mg/dL (ref 0.61–1.24)
GFR calc Af Amer: >60 mL/min (ref >60)
GFR calc non Af Amer: >60 mL/min (ref >60)
Glucose, Bld: 246 mg/dL — ABNORMAL HIGH (ref 70–99)
Potassium: 3.8 mmol/L (ref 3.5–5.1)
Sodium: 138 mmol/L (ref 135–145)

## 2020-03-18 LAB — CBC WITH DIFFERENTIAL/PLATELET
Abs Immature Granulocytes: 0.03 10*3/uL (ref 0.00–0.07)
Basophils Absolute: 0 10*3/uL (ref 0.0–0.1)
Basophils Relative: 0 %
Eosinophils Absolute: 0.3 10*3/uL (ref 0.0–0.5)
Eosinophils Relative: 3 %
HCT: 40.6 % (ref 39.0–52.0)
Hemoglobin: 14 g/dL (ref 13.0–17.0)
Immature Granulocytes: 0 %
Lymphocytes Relative: 13 %
Lymphs Abs: 1.5 10*3/uL (ref 0.7–4.0)
MCH: 31.9 pg (ref 26.0–34.0)
MCHC: 34.5 g/dL (ref 30.0–36.0)
MCV: 92.5 fL (ref 80.0–100.0)
Monocytes Absolute: 1.1 10*3/uL — ABNORMAL HIGH (ref 0.1–1.0)
Monocytes Relative: 10 %
Neutro Abs: 8.4 10*3/uL — ABNORMAL HIGH (ref 1.7–7.7)
Neutrophils Relative %: 74 %
Platelets: 299 10*3/uL (ref 150–400)
RBC: 4.39 MIL/uL (ref 4.22–5.81)
RDW: 12.7 % (ref 11.5–15.5)
WBC: 11.4 10*3/uL — ABNORMAL HIGH (ref 4.0–10.5)
nRBC: 0 % (ref 0.0–0.2)

## 2020-03-18 MED ORDER — CLINDAMYCIN PHOSPHATE 600 MG/50ML IV SOLN
600.0000 mg | Freq: Once | INTRAVENOUS | Status: AC
  Administered 2020-03-18: 600 mg via INTRAVENOUS
  Filled 2020-03-18: qty 50

## 2020-03-18 MED ORDER — DOXYCYCLINE HYCLATE 100 MG PO TABS
100.0000 mg | ORAL_TABLET | Freq: Once | ORAL | Status: AC
  Administered 2020-03-18: 100 mg via ORAL
  Filled 2020-03-18: qty 1

## 2020-03-18 MED ORDER — IOHEXOL 300 MG/ML  SOLN
100.0000 mL | Freq: Once | INTRAMUSCULAR | Status: AC | PRN
  Administered 2020-03-18: 100 mL via INTRAVENOUS

## 2020-03-18 MED ORDER — DOXYCYCLINE HYCLATE 100 MG PO CAPS: 100.0000 mg | ORAL_CAPSULE | Freq: Two times a day (BID) | ORAL | 0 refills | Status: AC

## 2020-03-18 NOTE — Discharge Instructions (Addendum)
Your CT scan does not show evidence of an abscess. There is nothing to drain. It does show cellulitis. This is why you are having pain, swelling, redness, increased warmth, etc. I want you to continue to take cephalexin (keflex) as prescribed. I am adding an additional antibiotic (doxycycline) to broaden coverage. If your symptoms do not begin to improve in 24-48 hours or if you feel like they are significantly worsening then I want you to be re-checked.

## 2020-03-18 NOTE — ED Triage Notes (Addendum)
Pt had injury to left LE 4/2-was seen at ED in [facility] and sutures to posterior ankle/heel area-pt c/o swelling to foot/ankle-NAD-limping gait

## 2020-03-20 ENCOUNTER — Encounter (HOSPITAL_BASED_OUTPATIENT_CLINIC_OR_DEPARTMENT_OTHER): Payer: Self-pay

## 2020-03-20 ENCOUNTER — Other Ambulatory Visit: Payer: Self-pay

## 2020-03-20 ENCOUNTER — Emergency Department (HOSPITAL_BASED_OUTPATIENT_CLINIC_OR_DEPARTMENT_OTHER)
Admission: EM | Admit: 2020-03-20 | Discharge: 2020-03-20 | Disposition: A | Discharge status: Home / Self Care | Payer: Medicare Other | Attending: Emergency Medicine | Admitting: Emergency Medicine

## 2020-03-20 ENCOUNTER — Emergency Department (HOSPITAL_BASED_OUTPATIENT_CLINIC_OR_DEPARTMENT_OTHER): Payer: Medicare Other

## 2020-03-20 DIAGNOSIS — Z87891 Personal history of nicotine dependence: Secondary | ICD-10-CM | POA: Diagnosis not present

## 2020-03-20 DIAGNOSIS — X58XXXD Exposure to other specified factors, subsequent encounter: Secondary | ICD-10-CM | POA: Insufficient documentation

## 2020-03-20 DIAGNOSIS — M79662 Pain in left lower leg: Secondary | ICD-10-CM | POA: Diagnosis present

## 2020-03-20 DIAGNOSIS — E119 Type 2 diabetes mellitus without complications: Secondary | ICD-10-CM | POA: Diagnosis not present

## 2020-03-20 DIAGNOSIS — I82492 Acute embolism and thrombosis of other specified deep vein of left lower extremity: Secondary | ICD-10-CM | POA: Insufficient documentation

## 2020-03-20 DIAGNOSIS — I824Y2 Acute embolism and thrombosis of unspecified deep veins of left proximal lower extremity: Secondary | ICD-10-CM

## 2020-03-20 DIAGNOSIS — I10 Essential (primary) hypertension: Secondary | ICD-10-CM | POA: Insufficient documentation

## 2020-03-20 DIAGNOSIS — S8992XD Unspecified injury of left lower leg, subsequent encounter: Secondary | ICD-10-CM | POA: Diagnosis not present

## 2020-03-20 DIAGNOSIS — Z79899 Other long term (current) drug therapy: Secondary | ICD-10-CM | POA: Insufficient documentation

## 2020-03-20 LAB — BASIC METABOLIC PANEL
Anion gap: 9 (ref 5–15)
BUN: 14 mg/dL (ref 8–23)
CO2: 23 mmol/L (ref 22–32)
Calcium: 9.3 mg/dL (ref 8.9–10.3)
Chloride: 107 mmol/L (ref 98–111)
Creatinine, Ser: 0.99 mg/dL (ref 0.61–1.24)
GFR calc Af Amer: >60 mL/min (ref >60)
GFR calc non Af Amer: >60 mL/min (ref >60)
Glucose, Bld: 194 mg/dL — ABNORMAL HIGH (ref 70–99)
Potassium: 4.2 mmol/L (ref 3.5–5.1)
Sodium: 139 mmol/L (ref 135–145)

## 2020-03-20 LAB — CBC WITH DIFFERENTIAL/PLATELET
Abs Immature Granulocytes: 0.04 10*3/uL (ref 0.00–0.07)
Basophils Absolute: 0 10*3/uL (ref 0.0–0.1)
Basophils Relative: 0 %
Eosinophils Absolute: 0.4 10*3/uL (ref 0.0–0.5)
Eosinophils Relative: 4 %
HCT: 43.3 % (ref 39.0–52.0)
Hemoglobin: 14.5 g/dL (ref 13.0–17.0)
Immature Granulocytes: 0 %
Lymphocytes Relative: 15 %
Lymphs Abs: 1.4 10*3/uL (ref 0.7–4.0)
MCH: 31.1 pg (ref 26.0–34.0)
MCHC: 33.5 g/dL (ref 30.0–36.0)
MCV: 92.9 fL (ref 80.0–100.0)
Monocytes Absolute: 1 10*3/uL (ref 0.1–1.0)
Monocytes Relative: 11 %
Neutro Abs: 6.4 10*3/uL (ref 1.7–7.7)
Neutrophils Relative %: 70 %
Platelets: 315 10*3/uL (ref 150–400)
RBC: 4.66 MIL/uL (ref 4.22–5.81)
RDW: 12.8 % (ref 11.5–15.5)
WBC: 9.3 10*3/uL (ref 4.0–10.5)
nRBC: 0 % (ref 0.0–0.2)

## 2020-03-20 MED ORDER — RIVAROXABAN (XARELTO) VTE STARTER PACK (15 & 20 MG): ORAL_TABLET | ORAL | 0 refills | Status: AC

## 2020-03-20 NOTE — ED Notes (Signed)
Ultrasound in progress  

## 2020-03-20 NOTE — Discharge Instructions (Addendum)
You were seen in the emergency department today with leg pain and swelling.  You do have a blood clot in your leg and we are starting you on blood thinner.  Please call your primary care doctor today to discuss your findings.  You are being started on a blood thinning medication which will make it harder for your blood to clot.  If you have a head injury or begin to see black or blood in your bowel movements you should stop this medicine and follow with your doctor immediately or be seen in the emergency department.   If you develop chest pain or shortness of breath you should also be seen in the emergency department to rule out blood clots in the lungs.  Please continue your antibiotics until the dose is complete and have your stitches removed as instructed by the initial emergency department.
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
## 2020-03-20 NOTE — ED Triage Notes (Signed)
Pt had injury posterior left ankle/heel, treated with antibiotics, comes in for recheck of wound, swelling, areas of blistering.

## 2020-03-23 NOTE — ED Provider Notes (Signed)
[facility] EMERGENCY DEPARTMENT Provider Note   CSN: [id number] Arrival date & time: 03/18/20  1902     History Chief Complaint  Patient presents with  . Leg Problem    James Duran is a 69 y.o. male.  HPI   69 year old male with left lower extremity/foot pain.  Patient sustained a laceration in the areas to the left Achilles a few days ago.  He was evaluated in outside hospital and had sutures placed.  In the last day or so he has had increasing pain and swelling.  Pain is worse with ambulation.  Currently on Keflex prophylactically.  No fevers or chills.  No drainage from the wound.  Past Medical History:  Diagnosis Date  . Arthritis   . Diabetes mellitus without complication (HCC)   . Hypertension   . Osteoarthritis of right hip 07/08/2014  . Primary localized osteoarthritis of left hip 11/20/2018    Patient Active Problem List   Diagnosis Date Noted  . Primary localized osteoarthritis of left hip 11/20/2018  . 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 Osteoarthritis of right hip 07/08/2014  . Hip arthritis 07/08/2014    Past Surgical History:  Procedure Laterality Date  . CERVICAL FUSION    . FOOT SURGERY Right   . TOTAL HIP ARTHROPLASTY Right 07/08/2014   Procedure: TOTAL HIP ARTHROPLASTY;  Surgeon: Eulas Post, MD;  Location: [facility] OR;  Service: Orthopedics;  Laterality: Right;  . TOTAL HIP ARTHROPLASTY Left 11/20/2018   Procedure: TOTAL HIP ARTHROPLASTY;  Surgeon: Teryl Lucy, MD;  Location: [facility];  Service: Orthopedics;  Laterality: Left;       No family history on file.  Social History   Tobacco Use  . Smoking status: Former Smoker    Packs/day: 0.00    Years: 40.00    Pack years: 0.00    Types: Cigars  . Smokeless tobacco: Never Used  Substance Use Topics  . Alcohol use: Yes    Comment: weekly  . Drug use: No    Home Medications Prior to Admission medications   Medication Sig Start Date End Date Taking? Authorizing  Provider  atorvastatin (LIPITOR) 80 MG tablet Take 80 mg by mouth daily.    [provider]  baclofen (LIORESAL) 10 MG tablet Take 1 tablet (10 mg total) by mouth 3 (three) times daily. As needed for muscle spasm 11/20/18   Teryl Lucy, MD  cephALEXin (KEFLEX) 500 MG capsule Take 500 mg by mouth 3 (three) times daily. 03/17/20   [provider]  doxycycline (VIBRAMYCIN) 100 MG capsule Take 1 capsule (100 mg total) by mouth 2 (two) times daily. 03/18/20   Raeford Razor, MD  HYDROcodone-acetaminophen (NORCO) 10-325 MG tablet Take 1 tablet by mouth every 6 (six) hours as needed. 11/20/18   Teryl Lucy, MD  losartan (COZAAR) 50 MG tablet Take 50 mg by mouth daily.    [provider]  metFORMIN (GLUCOPHAGE) 500 MG tablet Take 500 mg by mouth daily with breakfast.    [provider]  ondansetron (ZOFRAN) 4 MG tablet Take 1 tablet (4 mg total) by mouth every 8 (eight) hours as needed for nausea or vomiting. 11/20/18   Teryl Lucy, MD  RIVAROXABAN Carlena Hurl) VTE STARTER PACK (15 & 20 MG TABLETS) Follow package directions: Take one 15mg  tablet by mouth twice a day. On day 22, switch to one 20mg  tablet once a day. Take with food. 03/20/20   Long, , MD  sennosides-docusate sodium (SENOKOT-S) 8.6-50 MG  tablet Take 2 tablets by mouth daily. 11/20/18   Marchia Bond, MD    Allergies    Patient has no known allergies.  Review of Systems   Review of Systems All systems reviewed and negative, other than as noted in HPI.  Physical Exam Updated Vital Signs BP (!) 154/89 (BP Location: Left Arm)   Pulse 92   Temp 98.3 F (36.8 C) (Oral)   Resp 18   Ht 5\' 6"  (1.676 m)   Wt 96.6 kg   SpO2 99%   BMI 34.38 kg/m   Physical Exam Vitals and nursing note reviewed.  Constitutional:      General: He is not in acute distress.    Appearance: He is well-developed.  HENT:     Head: Normocephalic and atraumatic.  Eyes:     General:        Right eye: No discharge.         Left eye: No discharge.     Conjunctiva/sclera: Conjunctivae normal.  Cardiovascular:     Rate and Rhythm: Normal rate and regular rhythm.     Heart sounds: Normal heart sounds. No murmur. No friction rub. No gallop.   Pulmonary:     Effort: Pulmonary effort is normal. No respiratory distress.     Breath sounds: Normal breath sounds.  Abdominal:     General: There is no distension.     Palpations: Abdomen is soft.     Tenderness: There is no abdominal tenderness.  Musculoskeletal:        General: No tenderness.     Cervical back: Neck supple.  Skin:    General: Skin is warm and dry.     Comments: Left lower extremity ankle and foot swollen.  Increased warmth.  Erythema.  Sutured wound to the posterior distal left leg.  Intact.  No drainage noted.  Neurological:     Mental Status: He is alert.  Psychiatric:        Behavior: Behavior normal.        Thought Content: Thought content normal.     ED Results / Procedures / Treatments   Labs (all labs ordered are listed, but only abnormal results are displayed) Labs Reviewed  CBC WITH DIFFERENTIAL/PLATELET - Abnormal; Notable for the following components:      Result Value   WBC 11.4 (*)    Neutro Abs 8.4 (*)    Monocytes Absolute 1.1 (*)    All other components within normal limits  BASIC METABOLIC PANEL - Abnormal; Notable for the following components:   Glucose, Bld 246 (*)    Calcium 8.7 (*)    All other components within normal limits    EKG None  Radiology No results found.   CT ANKLE LEFT W CONTRAST  Result Date: 03/18/2020 CLINICAL DATA:  69 year old male with trauma to left ankle. EXAM: CT OF THE LEFT ANKLE WITH CONTRAST TECHNIQUE: Multidetector CT imaging of the left ankle was performed following the standard protocol during bolus administration of intravenous contrast. CONTRAST:  157mL OMNIPAQUE IOHEXOL 300 MG/ML  SOLN COMPARISON:  None. FINDINGS: Bones/Joint/Cartilage There is no acute fracture or dislocation. The  bones are well mineralized. No significant arthritic changes. The ankle mortise is intact. No joint effusion. Ligaments Suboptimally assessed by CT. Muscles and Tendons No acute findings. No intramuscular hematoma. Soft tissues There is diffuse skin thickening and subcutaneous soft tissue edema of the distal lower extremity and ankle most consistent with cellulitis. Clinical correlation is recommended. No drainable fluid collection  or abscess. IMPRESSION: 1. No acute fracture or dislocation. 2. Findings most consistent with cellulitis. Clinical correlation is recommended. No abscess. Electronically Signed   By: Elgie Collard M.D.   On: 03/18/2020 21:45   US Venous Img Lower Unilateral Left  Result Date: 03/20/2020 CLINICAL DATA:  Left leg pain and swelling EXAM: LEFT LOWER EXTREMITY VENOUS DOPPLER ULTRASOUND TECHNIQUE: Gray-scale sonography with graded compression, as well as color Doppler and duplex ultrasound were performed to evaluate the lower extremity deep venous systems from the level of the common femoral vein and including the common femoral, femoral, profunda femoral, popliteal and calf veins including the posterior tibial, peroneal and gastrocnemius veins when visible. The superficial great saphenous vein was also interrogated. Spectral Doppler was utilized to evaluate flow at rest and with distal augmentation maneuvers in the common femoral, femoral and popliteal veins. COMPARISON:  None. FINDINGS: Contralateral Common Femoral Vein: Respiratory phasicity is normal and symmetric with the symptomatic side. No evidence of thrombus. Normal compressibility. Common Femoral Vein: No evidence of thrombus. Normal compressibility, respiratory phasicity and response to augmentation. Saphenofemoral Junction: No evidence of thrombus. Normal compressibility and flow on color Doppler imaging. Profunda Femoral Vein: No evidence of thrombus. Normal compressibility and flow on color Doppler imaging. Femoral Vein: No  evidence of thrombus. Normal compressibility, respiratory phasicity and response to augmentation. Popliteal Vein: No evidence of thrombus. Normal compressibility, respiratory phasicity and response to augmentation. Calf Veins: Left calf posterior tibial vein demonstrates hypoechoic intraluminal thrombus. Vein appears distended and noncompressible. No phasic flow demonstrated. Thrombus appears occlusive. No propagation into the popliteal or femoral veins. Posterior tibial veins appear patent. IMPRESSION: Left calf posterior tibial DVT appears occlusive. No propagation into the popliteal or femoral veins. Electronically Signed   By: Judie Petit.  Shick M.D.   On: 03/20/2020 13:32    Procedures Procedures (including critical care time)  Medications Ordered in ED Medications  clindamycin (CLEOCIN) IVPB 600 mg (0 mg Intravenous Stopped 03/18/20 2042)  iohexol (OMNIPAQUE) 300 MG/ML solution 100 mL (100 mLs Intravenous Contrast Given 03/18/20 2121)  doxycycline (VIBRA-TABS) tablet 100 mg (100 mg Oral Given 03/18/20 2201)    ED Course  I have reviewed the triage vital signs and the nursing notes.  Pertinent labs & imaging results that were available during my care of the patient were reviewed by me and considered in my medical decision making (see chart for details).    MDM Rules/Calculators/A&P                      69 year old male with increasing pain and swelling in left lower extremity after sustaining laceration few days ago.  Will broaden antibiotic coverage with doxycycline.  Imaging without evidence of drainable deep space collection.  Return precautions discussed.  Repeat evaluation if symptoms do not begin to improve in the next 1 to 2 days.  Final Clinical Impression(s) / ED Diagnoses Final diagnoses:  Cellulitis of left lower extremity    Rx / DC Orders ED Discharge Orders         Ordered    doxycycline (VIBRAMYCIN) 100 MG capsule  2 times daily     03/18/20 2158           Raeford Razor,  MD 03/23/20 1256

## 2021-08-15 IMAGING — US US EXTREM LOW VENOUS*L*
1 series · 13 of 24 positions shown · non-contrast
Comparison: None.

CLINICAL DATA: Left leg pain and swelling



[Series 1: us extrem low venous*left* · 13 of 40 slices shown]
[im 1/40]
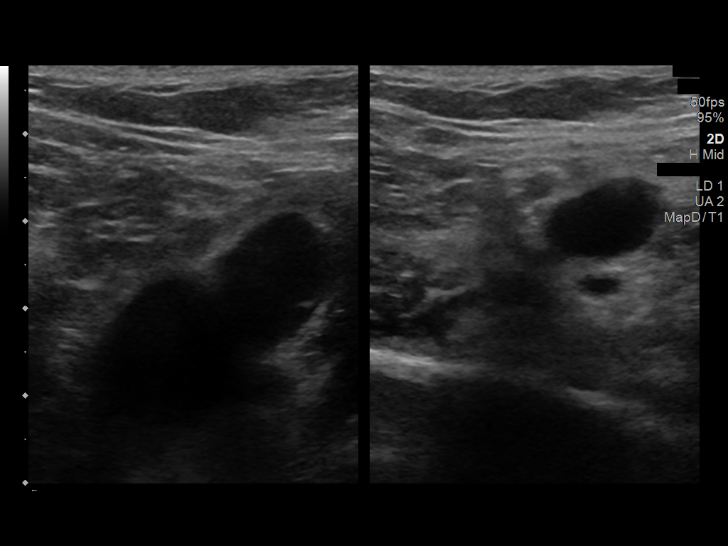
[im 4/40]
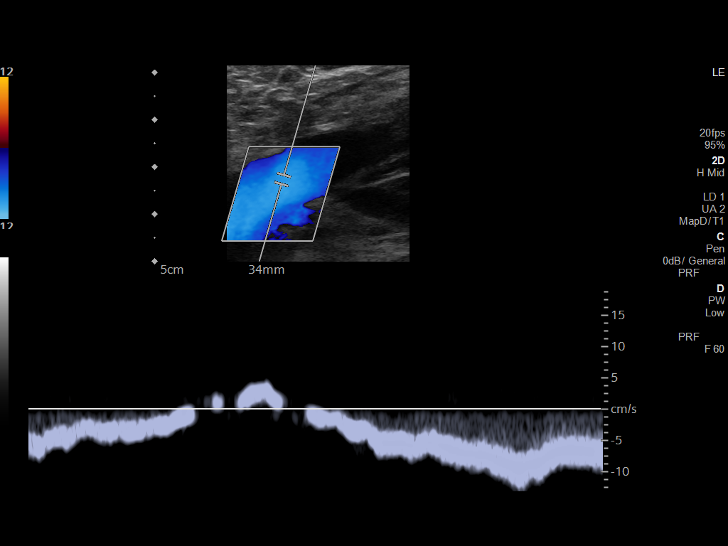
[im 7/40]
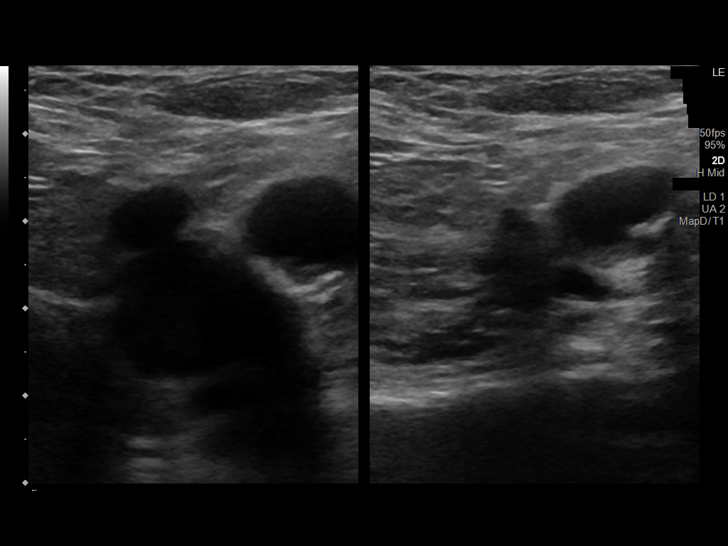
[im 11/40]
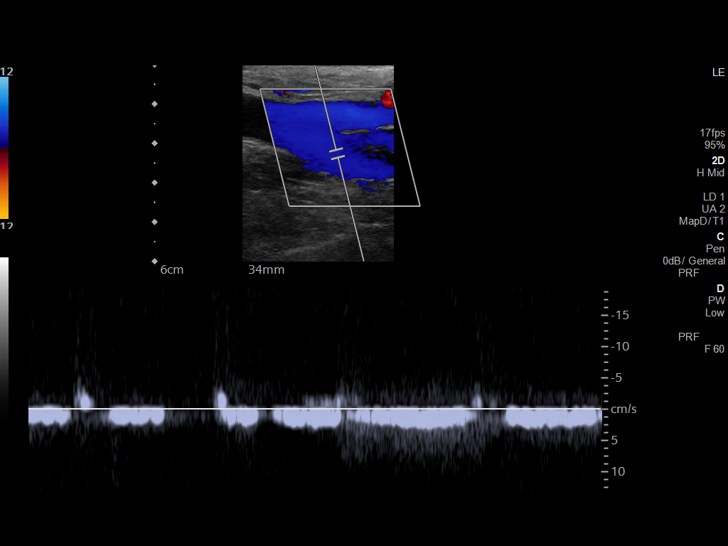
[im 14/40]
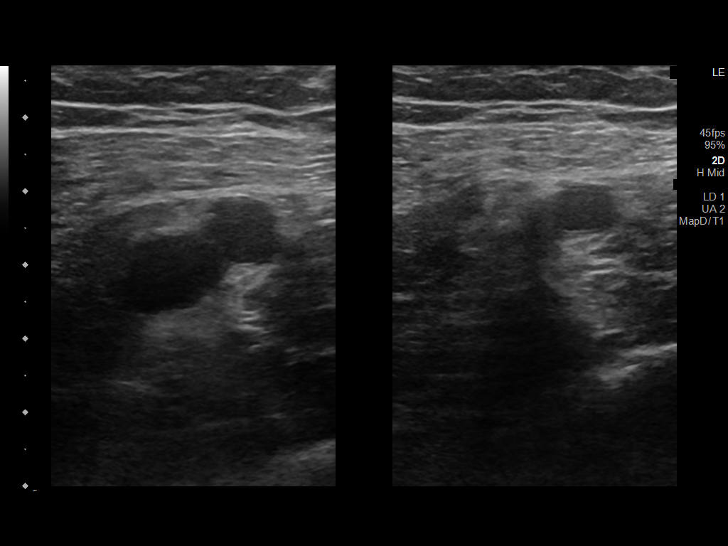
[im 17/40]
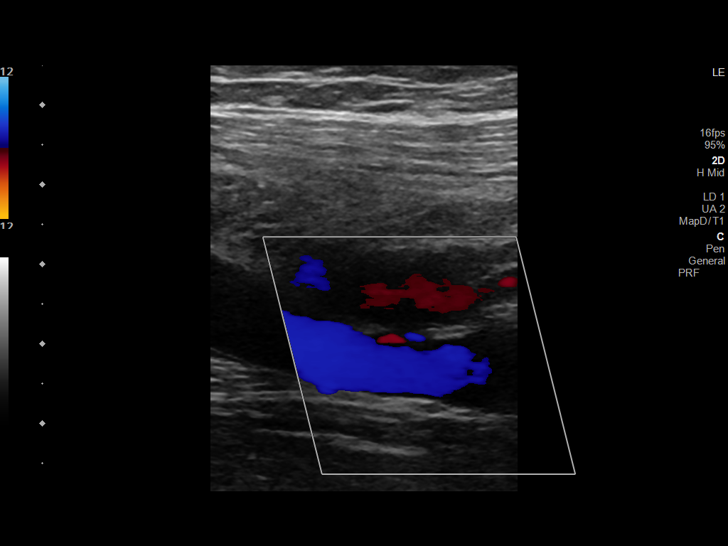
[im 21/40]
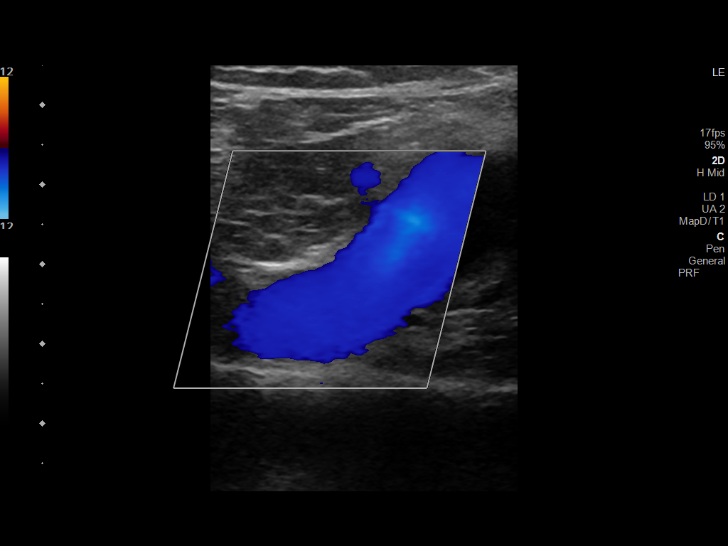
[im 23/40]
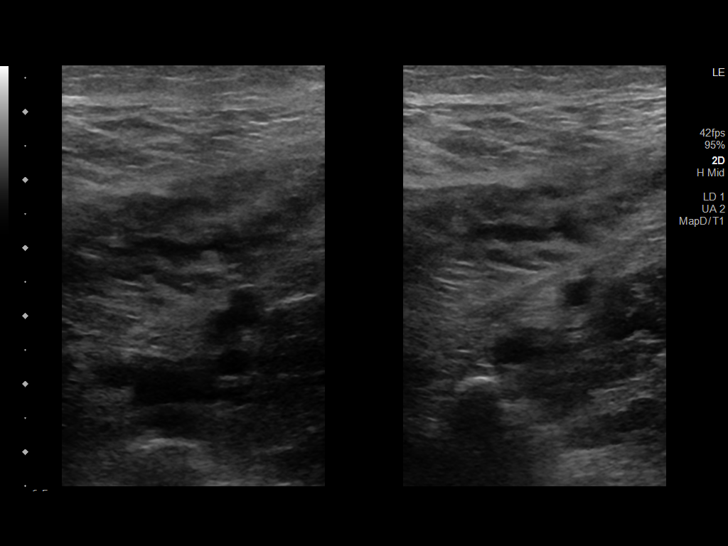
[im 26/40]
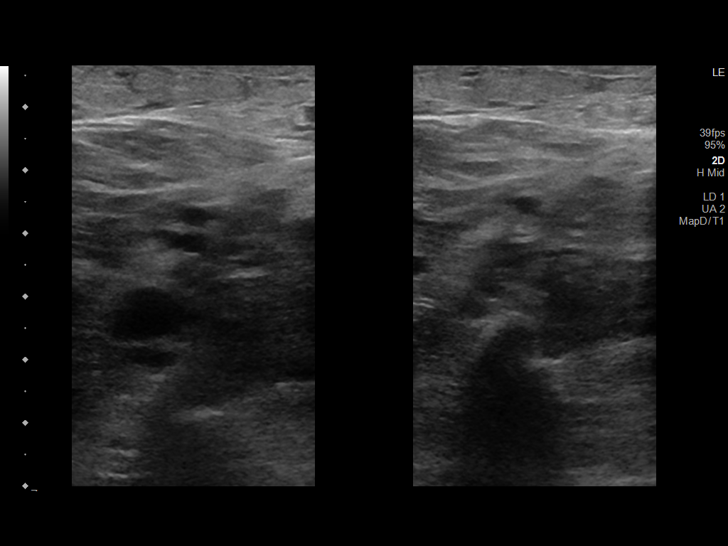
[im 29/40]
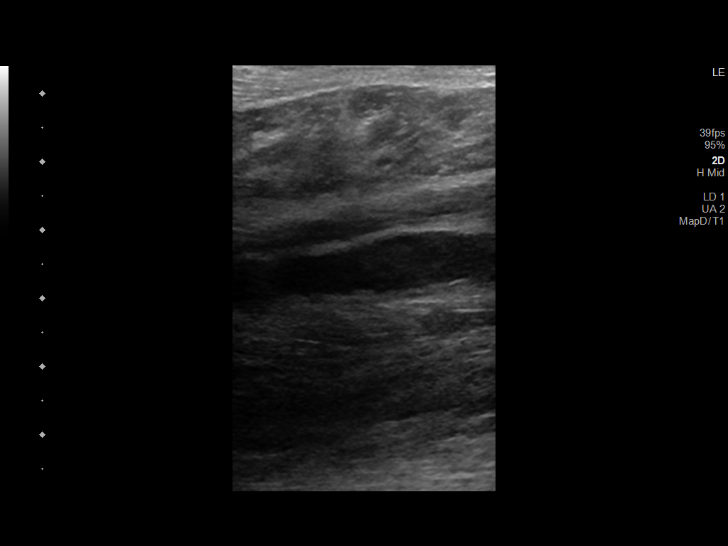
[im 33/40]
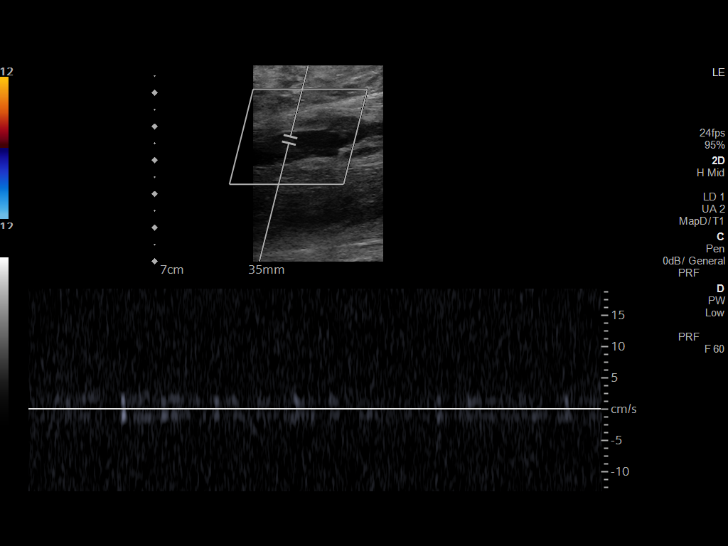
[im 36/40]
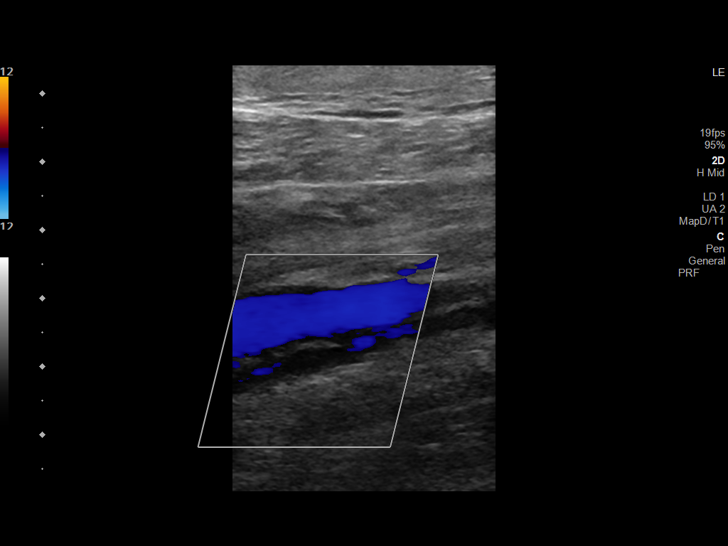
[im 40/40]
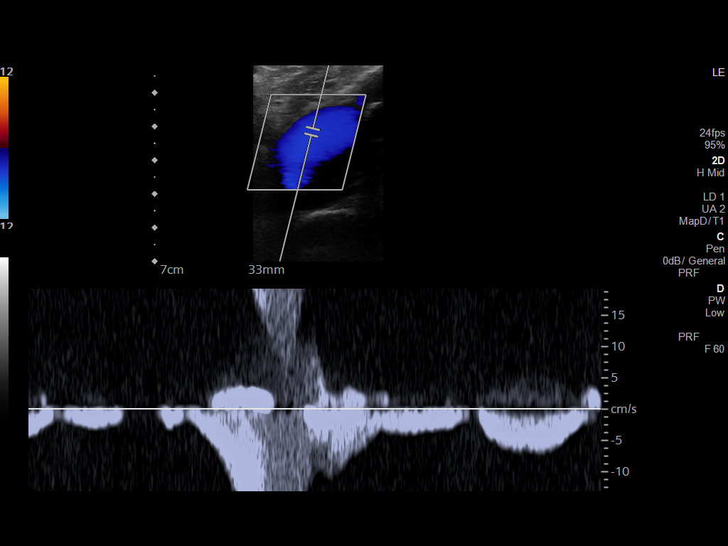

[13 of 24 positions shown; findings below may reference images not displayed]

FINDINGS: Contralateral Common Femoral Vein: Respiratory phasicity is normal
and symmetric with the symptomatic side. No evidence of thrombus.
Normal compressibility.

Common Femoral Vein: No evidence of thrombus. Normal
compressibility, respiratory phasicity and response to augmentation.

Saphenofemoral Junction: No evidence of thrombus. Normal
compressibility and flow on color Doppler imaging.

Profunda Femoral Vein: No evidence of thrombus. Normal
compressibility and flow on color Doppler imaging.

Femoral Vein: No evidence of thrombus. Normal compressibility,
respiratory phasicity and response to augmentation.

Popliteal Vein: No evidence of thrombus. Normal compressibility,
respiratory phasicity and response to augmentation.

Calf Veins: Left calf posterior tibial vein demonstrates hypoechoic
intraluminal thrombus. Vein appears distended and noncompressible.
No phasic flow demonstrated. Thrombus appears occlusive. No
propagation into the popliteal or femoral veins. Posterior tibial
veins appear patent.
IMPRESSION: Left calf posterior tibial DVT appears occlusive. No propagation
into the popliteal or femoral veins.
